# Patient Record
Sex: Female | Born: 1945 | Race: White | Hispanic: No | Marital: Married | Smoking: Never smoker
Health system: Southern US, Community
[De-identification: ages and names within clinical notes are randomized; demographics above are authoritative.]

## PROBLEM LIST (undated history)

## (undated) DIAGNOSIS — F32A Depression, unspecified: Secondary | ICD-10-CM

## (undated) DIAGNOSIS — F329 Major depressive disorder, single episode, unspecified: Secondary | ICD-10-CM

## (undated) DIAGNOSIS — E7800 Pure hypercholesterolemia, unspecified: Secondary | ICD-10-CM

## (undated) DIAGNOSIS — M810 Age-related osteoporosis without current pathological fracture: Secondary | ICD-10-CM

## (undated) DIAGNOSIS — F99 Mental disorder, not otherwise specified: Secondary | ICD-10-CM

## (undated) DIAGNOSIS — I10 Essential (primary) hypertension: Secondary | ICD-10-CM

## (undated) DIAGNOSIS — I509 Heart failure, unspecified: Secondary | ICD-10-CM

## (undated) DIAGNOSIS — R011 Cardiac murmur, unspecified: Secondary | ICD-10-CM

## (undated) DIAGNOSIS — F419 Anxiety disorder, unspecified: Secondary | ICD-10-CM

## (undated) DIAGNOSIS — M1990 Unspecified osteoarthritis, unspecified site: Secondary | ICD-10-CM

## (undated) DIAGNOSIS — I421 Obstructive hypertrophic cardiomyopathy: Secondary | ICD-10-CM

## (undated) HISTORY — PX: ABDOMINAL HYSTERECTOMY: SHX81

## (undated) HISTORY — PX: APPENDECTOMY: SHX54

## (undated) HISTORY — PX: OTHER SURGICAL HISTORY: SHX169

## (undated) HISTORY — PX: COLONOSCOPY: SHX174

## (undated) HISTORY — PX: TUBAL LIGATION: SHX77

## (undated) HISTORY — PX: INSERT / REPLACE / REMOVE PACEMAKER: SUR710

---

## 2007-07-13 ENCOUNTER — Other Ambulatory Visit: Payer: Self-pay

## 2007-07-13 ENCOUNTER — Emergency Department: Payer: Self-pay | Admitting: Emergency Medicine

## 2012-12-02 ENCOUNTER — Ambulatory Visit: Payer: Self-pay | Admitting: Diagnostic Radiology

## 2012-12-02 LAB — CREATININE, SERUM
Creatinine: 0.94 mg/dL (ref 0.60–1.30)
EGFR (African American): >60
EGFR (Non-African Amer.): >60

## 2012-12-03 ENCOUNTER — Ambulatory Visit: Payer: Self-pay | Admitting: Family Medicine

## 2012-12-22 ENCOUNTER — Ambulatory Visit: Payer: Self-pay | Admitting: Gastroenterology

## 2012-12-23 LAB — PATHOLOGY REPORT

## 2013-03-15 ENCOUNTER — Ambulatory Visit: Payer: Self-pay | Admitting: Physical Medicine and Rehabilitation

## 2014-08-11 ENCOUNTER — Ambulatory Visit
Admit: 2014-08-11 | Disposition: A | Discharge status: Home / Self Care | Payer: Self-pay | Attending: Physical Medicine and Rehabilitation | Admitting: Physical Medicine and Rehabilitation

## 2014-11-11 ENCOUNTER — Other Ambulatory Visit: Payer: Self-pay | Admitting: Family Medicine

## 2014-11-11 DIAGNOSIS — Z1239 Encounter for other screening for malignant neoplasm of breast: Secondary | ICD-10-CM

## 2015-03-29 ENCOUNTER — Encounter: Payer: Self-pay | Admitting: *Deleted

## 2015-03-30 ENCOUNTER — Ambulatory Visit
Admission: RE | Admit: 2015-03-30 | Discharge: 2015-03-30 | Disposition: A | Discharge status: Home / Self Care | Payer: Medicare PPO | Source: Ambulatory Visit | Attending: Gastroenterology | Admitting: Gastroenterology

## 2015-03-30 ENCOUNTER — Encounter: Payer: Self-pay | Admitting: *Deleted

## 2015-03-30 ENCOUNTER — Ambulatory Visit: Payer: Medicare PPO | Admitting: Certified Registered Nurse Anesthetist

## 2015-03-30 ENCOUNTER — Encounter
Admission: RE | Disposition: A | Discharge status: Home / Self Care | Payer: Self-pay | Source: Ambulatory Visit | Attending: Gastroenterology

## 2015-03-30 DIAGNOSIS — Z888 Allergy status to other drugs, medicaments and biological substances status: Secondary | ICD-10-CM | POA: Diagnosis not present

## 2015-03-30 DIAGNOSIS — K210 Gastro-esophageal reflux disease with esophagitis: Secondary | ICD-10-CM | POA: Insufficient documentation

## 2015-03-30 DIAGNOSIS — K582 Mixed irritable bowel syndrome: Secondary | ICD-10-CM | POA: Diagnosis not present

## 2015-03-30 DIAGNOSIS — R634 Abnormal weight loss: Secondary | ICD-10-CM | POA: Diagnosis not present

## 2015-03-30 DIAGNOSIS — F419 Anxiety disorder, unspecified: Secondary | ICD-10-CM | POA: Diagnosis not present

## 2015-03-30 DIAGNOSIS — M170 Bilateral primary osteoarthritis of knee: Secondary | ICD-10-CM | POA: Insufficient documentation

## 2015-03-30 DIAGNOSIS — M810 Age-related osteoporosis without current pathological fracture: Secondary | ICD-10-CM | POA: Diagnosis not present

## 2015-03-30 DIAGNOSIS — I10 Essential (primary) hypertension: Secondary | ICD-10-CM | POA: Diagnosis not present

## 2015-03-30 DIAGNOSIS — E559 Vitamin D deficiency, unspecified: Secondary | ICD-10-CM | POA: Insufficient documentation

## 2015-03-30 DIAGNOSIS — E785 Hyperlipidemia, unspecified: Secondary | ICD-10-CM | POA: Insufficient documentation

## 2015-03-30 DIAGNOSIS — Z885 Allergy status to narcotic agent status: Secondary | ICD-10-CM | POA: Insufficient documentation

## 2015-03-30 DIAGNOSIS — Z950 Presence of cardiac pacemaker: Secondary | ICD-10-CM | POA: Diagnosis not present

## 2015-03-30 DIAGNOSIS — Z86010 Personal history of colonic polyps: Secondary | ICD-10-CM | POA: Diagnosis not present

## 2015-03-30 DIAGNOSIS — K449 Diaphragmatic hernia without obstruction or gangrene: Secondary | ICD-10-CM | POA: Insufficient documentation

## 2015-03-30 DIAGNOSIS — Z79899 Other long term (current) drug therapy: Secondary | ICD-10-CM | POA: Diagnosis not present

## 2015-03-30 DIAGNOSIS — R110 Nausea: Secondary | ICD-10-CM | POA: Diagnosis present

## 2015-03-30 HISTORY — PX: ESOPHAGOGASTRODUODENOSCOPY (EGD) WITH PROPOFOL: SHX5813

## 2015-03-30 HISTORY — DX: Pure hypercholesterolemia, unspecified: E78.00

## 2015-03-30 HISTORY — DX: Essential (primary) hypertension: I10

## 2015-03-30 HISTORY — DX: Depression, unspecified: F32.A

## 2015-03-30 HISTORY — DX: Mental disorder, not otherwise specified: F99

## 2015-03-30 HISTORY — DX: Major depressive disorder, single episode, unspecified: F32.9

## 2015-03-30 HISTORY — DX: Anxiety disorder, unspecified: F41.9

## 2015-03-30 HISTORY — DX: Heart failure, unspecified: I50.9

## 2015-03-30 HISTORY — DX: Cardiac murmur, unspecified: R01.1

## 2015-03-30 HISTORY — DX: Unspecified osteoarthritis, unspecified site: M19.90

## 2015-03-30 HISTORY — DX: Age-related osteoporosis without current pathological fracture: M81.0

## 2015-03-30 HISTORY — DX: Obstructive hypertrophic cardiomyopathy: I42.1

## 2015-03-30 SURGERY — ESOPHAGOGASTRODUODENOSCOPY (EGD) WITH PROPOFOL
Anesthesia: General

## 2015-03-30 MED ORDER — SODIUM CHLORIDE 0.9 % IV SOLN
INTRAVENOUS | Status: DC
  Administered 2015-03-30: 11:00:00 via INTRAVENOUS

## 2015-03-30 MED ORDER — GLYCOPYRROLATE 0.2 MG/ML IJ SOLN
INTRAMUSCULAR | Status: DC | PRN
  Administered 2015-03-30: 0.2 mg via INTRAVENOUS

## 2015-03-30 MED ORDER — MIDAZOLAM HCL 2 MG/2ML IJ SOLN
INTRAMUSCULAR | Status: DC | PRN
  Administered 2015-03-30: 1 mg via INTRAVENOUS

## 2015-03-30 MED ORDER — PROPOFOL 10 MG/ML IV BOLUS
INTRAVENOUS | Status: DC | PRN
  Administered 2015-03-30 (×5): 20 mg via INTRAVENOUS

## 2015-03-30 MED ORDER — PROPOFOL 500 MG/50ML IV EMUL
INTRAVENOUS | Status: DC | PRN
Start: 2015-03-30 — End: 2015-03-30
  Administered 2015-03-30: 140 ug/kg/min via INTRAVENOUS

## 2015-03-30 NOTE — Anesthesia Preprocedure Evaluation (Signed)
Anesthesia Evaluation  Patient identified by MRN, date of birth, ID band Patient awake    Reviewed: Allergy & Precautions, NPO status , Patient's Chart, lab work & pertinent test results, reviewed documented beta blocker date and time   Airway Mallampati: III       Dental no notable dental hx.    Pulmonary    breath sounds clear to auscultation       Cardiovascular hypertension, Pt. on medications and Pt. on home beta blockers +CHF  + pacemaker  Rhythm:Regular     Neuro/Psych Anxiety    GI/Hepatic negative GI ROS, Neg liver ROS,   Endo/Other  negative endocrine ROS  Renal/GU negative Renal ROS     Musculoskeletal   Abdominal Normal abdominal exam  (+)   Peds  Hematology negative hematology ROS (+)   Anesthesia Other Findings   Reproductive/Obstetrics                             Anesthesia Physical Anesthesia Plan  ASA: III  Anesthesia Plan: General   Post-op Pain Management:    Induction: Intravenous  Airway Management Planned: Nasal Cannula  Additional Equipment:   Intra-op Plan:   Post-operative Plan:   Informed Consent: I have reviewed the patients History and Physical, chart, labs and discussed the procedure including the risks, benefits and alternatives for the proposed anesthesia with the patient or authorized representative who has indicated his/her understanding and acceptance.     Plan Discussed with: CRNA  Anesthesia Plan Comments:         Anesthesia Quick Evaluation

## 2015-03-30 NOTE — Anesthesia Procedure Notes (Signed)
Date/Time: 03/30/2015 11:02 AM Performed by: Ginger CarneMICHELET, Destaney Sarkis Pre-anesthesia Checklist: Patient identified, Emergency Drugs available, Suction available and Patient being monitored Patient Re-evaluated:Patient Re-evaluated prior to inductionOxygen Delivery Method: Nasal cannula

## 2015-03-30 NOTE — Transfer of Care (Signed)
Immediate Anesthesia Transfer of Care Note  Patient: Rhonda HerterJoanne Neathery Coppolino  Procedure(s) Performed: Procedure(s): ESOPHAGOGASTRODUODENOSCOPY (EGD) WITH PROPOFOL (N/A)  Patient Location: PACU  Anesthesia Type:General  Level of Consciousness: sedated  Airway & Oxygen Therapy: Patient Spontanous Breathing and Patient connected to nasal cannula oxygen  Post-op Assessment: Report given to RN and Post -op Vital signs reviewed and stable  Post vital signs: Reviewed and stable  Last Vitals:  Filed Vitals:   03/30/15 1030 03/30/15 1115  BP: 137/62 100/51  Pulse: 70 70  Temp: 36.9 C 36.3 C  Resp: 19 16    Complications: No apparent anesthesia complications

## 2015-03-30 NOTE — Op Note (Signed)
[facility] Gastroenterology Patient Name: Rhonda Skinner Procedure Date: 03/30/2015 11:02 AM MRN: [id number] Account #: [id number] Date of Birth: Nov 01, 1945 Admit Type: Outpatient Age: 69 [facility] ROOM 4 Gender: Female Note Status: Finalized Procedure:         Upper GI endoscopy Indications:       Suspected esophageal reflux, Anorexia, Nausea, Weight loss Providers:         Ezzard StandingPaul Y. Bluford Kaufmannh, MD Referring MD:      Leim FabryBarbara Aldridge, MD (Referring MD) Medicines:         Monitored Anesthesia Care Complications:     No immediate complications. Procedure:         Pre-Anesthesia Assessment:                    - Prior to the procedure, a History and Physical was                     performed, and patient medications, allergies and                     sensitivities were reviewed. The patient's tolerance of                     previous anesthesia was reviewed.                    - The risks and benefits of the procedure and the sedation                     options and risks were discussed with the patient. All                     questions were answered and informed consent was obtained.                    - After reviewing the risks and benefits, the patient was                     deemed in satisfactory condition to undergo the procedure.                    After obtaining informed consent, the endoscope was passed                     under direct vision. Throughout the procedure, the                     patient's blood pressure, pulse, and oxygen saturations                     were monitored continuously. The Olympus GIF-160 endoscope                     (S#. [id number]) was introduced through the mouth, and                     advanced to the second part of duodenum. The upper GI                     endoscopy was accomplished without difficulty. The patient                     tolerated the procedure well. Findings:      LA Grade A (one or more mucosal  breaks less  than 5 mm, not extending       between tops of 2 mucosal folds) esophagitis was found at the       gastroesophageal junction. Biopsies were taken with a cold forceps for       histology.      The exam was otherwise without abnormality.      A small hiatus hernia was present.      The exam was otherwise without abnormality.      The examined duodenum was normal. Impression:        - LA Grade A reflux esophagitis. Biopsied.                    - The examination was otherwise normal.                    - Small hiatus hernia.                    - The examination was otherwise normal.                    - Normal examined duodenum. Recommendation:    - Discharge patient to home.                    - Observe patient's clinical course.                    - Continue present medications.                    - The findings and recommendations were discussed with the                     patient. Procedure Code(s): --- Professional ---                    [phone], Esophagogastroduodenoscopy, flexible, transoral;                     with biopsy, single or multiple Diagnosis Code(s): --- Professional ---                    K21.0, Gastro-esophageal reflux disease with esophagitis                    K44.9, Diaphragmatic hernia without obstruction or gangrene                    R63.0, Anorexia                    R11.0, Nausea                    R63.4, Abnormal weight loss CPT copyright 2014 American Medical Association. All rights reserved. The codes documented in this report are preliminary and upon coder review may  be revised to meet current compliance requirements. Wallace Cullens, MD 03/30/2015 11:13:32 AM This report has been signed electronically. Number of Addenda: 0 Note Initiated On: 03/30/2015 11:02 AM      [facility]

## 2015-03-30 NOTE — H&P (Signed)
  Date of Initial H&P: 03/14/2015  History reviewed, patient examined, no change in status, stable for surgery. 

## 2015-03-30 NOTE — Anesthesia Postprocedure Evaluation (Signed)
Anesthesia Post Note  Patient: Rhonda Skinner  Procedure(s) Performed: Procedure(s) (LRB): ESOPHAGOGASTRODUODENOSCOPY (EGD) WITH PROPOFOL (N/A)  Patient location during evaluation: PACU Anesthesia Type: General Level of consciousness: awake Pain management: satisfactory to patient Vital Signs Assessment: post-procedure vital signs reviewed and stable Respiratory status: spontaneous breathing Cardiovascular status: stable Anesthetic complications: no    Last Vitals:  Filed Vitals:   03/30/15 1125 03/30/15 1136  BP: 109/61 116/60  Pulse: 75 71  Temp:    Resp: 18 17    Last Pain: There were no vitals filed for this visit.               VAN STAVEREN,Savonna Birchmeier

## 2015-03-31 LAB — SURGICAL PATHOLOGY

## 2015-04-01 ENCOUNTER — Encounter: Payer: Self-pay | Admitting: Gastroenterology

## 2017-05-30 ENCOUNTER — Ambulatory Visit (INDEPENDENT_AMBULATORY_CARE_PROVIDER_SITE_OTHER): Payer: Medicare PPO

## 2017-05-30 ENCOUNTER — Encounter: Payer: Self-pay | Admitting: *Deleted

## 2017-05-30 ENCOUNTER — Ambulatory Visit
Admission: EM | Admit: 2017-05-30 | Discharge: 2017-05-30 | Disposition: A | Discharge status: Home / Self Care | Payer: Medicare PPO | Attending: Family Medicine | Admitting: Family Medicine

## 2017-05-30 DIAGNOSIS — W19XXXA Unspecified fall, initial encounter: Secondary | ICD-10-CM | POA: Diagnosis not present

## 2017-05-30 DIAGNOSIS — S63502A Unspecified sprain of left wrist, initial encounter: Secondary | ICD-10-CM

## 2017-05-30 DIAGNOSIS — S60222A Contusion of left hand, initial encounter: Secondary | ICD-10-CM

## 2017-05-30 NOTE — ED Notes (Signed)
Velcro splint applied left wrist. PMS intact post application

## 2017-05-30 NOTE — ED Provider Notes (Signed)
[facility]    CSN: [id number] Arrival date & time: 05/30/17  1657     History   Chief Complaint Chief Complaint  Patient presents with  . Fall  . Hand Injury    HPI Rhonda Skinner is a 72 y.o. female.   72 yo female with a c/o left hand and wrist pain since falling and landing on her hand a few hours ago. Denies any numbness/tingling. Has swelling and has been applying ice.    The history is provided by the patient.  Fall   Hand Injury    Past Medical History:  Diagnosis Date  . Anxiety   . Arthritis   . CHF (congestive heart failure) (HCC)   . Depression   . Heart murmur   . HOCM (hypertrophic obstructive cardiomyopathy) (HCC)   . Hypercholesteremia   . Hypertension   . Osteoarthritis   . Osteoporosis   . Psychiatric illness     There are no active problems to display for this patient.   Past Surgical History:  Procedure Laterality Date  . ABDOMINAL HYSTERECTOMY    . alcohol ablation of hocm    . APPENDECTOMY    . COLONOSCOPY    . ESOPHAGOGASTRODUODENOSCOPY (EGD) WITH PROPOFOL N/A 03/30/2015   Procedure: ESOPHAGOGASTRODUODENOSCOPY (EGD) WITH PROPOFOL;  Surgeon: Wallace CullensPaul Y Oh, MD;  [facility];  Service: Gastroenterology;  Laterality: N/A;  . INSERT / REPLACE / REMOVE PACEMAKER    . TUBAL LIGATION      OB History    No data available       Home Medications    Prior to Admission medications   Medication Sig Start Date End Date Taking? Authorizing Provider  acetaminophen (TYLENOL) 500 MG tablet Take 500 mg by mouth every 6 (six) hours as needed.   Yes [provider]  calcium-vitamin D (OSCAL WITH D) 500-200 MG-UNIT tablet Take 1 tablet by mouth.   Yes [provider]  carvedilol (COREG) 25 MG tablet Take 25 mg by mouth 2 (two) times daily with a meal.   Yes [provider]  Cholecalciferol 1000 UNIT/10ML LIQD Take by mouth.   Yes [provider]  desvenlafaxine (PRISTIQ) 50 MG  24 hr tablet Take 50 mg by mouth daily.   Yes [provider]  diltiazem (TIAZAC) 360 MG 24 hr capsule Take 360 mg by mouth daily.   Yes [provider]  gabapentin (NEURONTIN) 300 MG capsule Take 300 mg by mouth 3 (three) times daily.   Yes [provider]  losartan (COZAAR) 100 MG tablet Take 100 mg by mouth daily.   Yes [provider]  Multiple Vitamin (MULTIVITAMIN) tablet Take 1 tablet by mouth daily.   Yes [provider]  pravastatin (PRAVACHOL) 40 MG tablet Take 40 mg by mouth daily.   Yes [provider]  ranitidine (ZANTAC) 300 MG tablet Take 300 mg by mouth at bedtime.   Yes [provider]  spironolactone (ALDACTONE) 25 MG tablet Take 25 mg by mouth daily.   Yes [provider]  tizanidine (ZANAFLEX) 2 MG capsule Take 2 mg by mouth 3 (three) times daily.   Yes [provider]  furosemide (LASIX) 20 MG tablet Take 20 mg by mouth.    [provider]  HYDROcodone-acetaminophen (NORCO/VICODIN) 5-325 MG tablet Take 1 tablet by mouth every 6 (six) hours as needed for moderate pain.    [provider]    Family History Family History  Problem Relation Age  of Onset  . Heart Problems Mother   . Cancer Father     Social History Social History   Tobacco Use  . Smoking status: Never Smoker  . Smokeless tobacco: Never Used  Substance Use Topics  . Alcohol use: No  . Drug use: No     Allergies   Oxycodone; Percocet [oxycodone-acetaminophen]; Phenobarbital; and Vasotec [enalapril maleate]   Review of Systems Review of Systems   Physical Exam Triage Vital Signs ED Triage Vitals  Enc Vitals Group     BP 05/30/17 1720 (!) 163/56     Pulse Rate 05/30/17 1720 60     Resp 05/30/17 1720 16     Temp 05/30/17 1720 98.2 F (36.8 C)     Temp Source 05/30/17 1720 Oral     SpO2 05/30/17 1720 98 %     Weight 05/30/17 1724 143 lb (64.9 kg)     Height 05/30/17 1724 5\' 2"  (1.575 m)      Head Circumference --      Peak Flow --      Pain Score 05/30/17 1724 6     Pain Loc --      Pain Edu? --      Excl. in GC? --    No data found.  Updated Vital Signs BP (!) 163/56 (BP Location: Left Arm)   Pulse 60   Temp 98.2 F (36.8 C) (Oral)   Resp 16   Ht 5\' 2"  (1.575 m)   Wt 143 lb (64.9 kg)   SpO2 98%   BMI 26.16 kg/m   Visual Acuity Right Eye Distance:   Left Eye Distance:   Bilateral Distance:    Right Eye Near:   Left Eye Near:    Bilateral Near:     Physical Exam  Constitutional: She appears well-developed and well-nourished. No distress.  Musculoskeletal:       Left wrist: She exhibits tenderness, bony tenderness and swelling (mild). She exhibits normal range of motion, no effusion, no crepitus, no deformity and no laceration.       Left hand: She exhibits tenderness, bony tenderness (diffuse) and swelling (mild). She exhibits normal range of motion, normal two-point discrimination, normal capillary refill, no deformity and no laceration. Normal sensation noted. Normal strength noted.  Skin: She is not diaphoretic.  Nursing note and vitals reviewed.    UC Treatments / Results  Labs (all labs ordered are listed, but only abnormal results are displayed) Labs Reviewed - No data to display  EKG  EKG Interpretation None       Radiology Dg Wrist Complete Left  Result Date: 05/30/2017 CLINICAL DATA:  72 year old female status post fall on outstretched hand. Pain radiating to the base of the left thumb and wrist. EXAM: LEFT WRIST - COMPLETE 3+ VIEW COMPARISON:  Left hand series today reported separately. FINDINGS: Mild osteopenia. The distal left radius and ulna appear intact. Carpal bone alignment and joint spaces are within normal limits. No scaphoid fracture identified. The carpometacarpal joint spaces are normal for age. The visible metacarpals appear intact. IMPRESSION: No acute fracture or dislocation identified about the left wrist. Osteopenia,  consider follow-up radiographs if symptoms persist. Electronically Signed   By: Odessa Fleming M.D.   On: 05/30/2017 18:12   Dg Hand Complete Left  Result Date: 05/30/2017 CLINICAL DATA:  72 year old female status post fall on outstretched hand. Pain radiating to the base of the left thumb and wrist. EXAM: LEFT HAND - COMPLETE 3+ VIEW COMPARISON:  None.  FINDINGS: Distal radius and ulna appear intact. Carpal bone alignment and joint spaces within normal limits. The basal joint of the left thumb appears normal for age. The left thumb metacarpal and phalanges appear intact and normally aligned. Other metacarpals and phalanges appear intact. No acute osseous abnormality identified. IMPRESSION: No acute fracture or dislocation identified about the left hand. Electronically Signed   By: Odessa Fleming M.D.   On: 05/30/2017 18:10    Procedures Procedures (including critical care time)  Medications Ordered in UC Medications - No data to display   Initial Impression / Assessment and Plan / UC Course  I have reviewed the triage vital signs and the nursing notes.  Pertinent labs & imaging results that were available during my care of the patient were reviewed by me and considered in my medical decision making (see chart for details).       Final Clinical Impressions(s) / UC Diagnoses   Final diagnoses:  Contusion of left hand, initial encounter  Sprain of left wrist, initial encounter    ED Discharge Orders    None     1. x-ray results and diagnosis reviewed with patient 2. Recommend supportive treatment with rest, ice, otc tylenol prn; wrist splint 3. Follow-up prn if symptoms worsen or don't improve  Controlled Substance Prescriptions [place] Controlled Substance Registry consulted? Not Applicable   Payton Mccallum, MD 05/30/17 [phone]

## 2017-05-30 NOTE — Discharge Instructions (Signed)
Rest, ice, wrist splint

## 2017-05-30 NOTE — ED Triage Notes (Signed)
Pt was bending over and lost her balance, landed on left hand. Now c/o left hand pain and redness.

## 2017-06-25 ENCOUNTER — Inpatient Hospital Stay
Admission: EM | Admit: 2017-06-25 | Discharge: 2017-06-28 | DRG: 202 | Disposition: A | Discharge status: Home / Self Care | Payer: Medicare PPO | Attending: Internal Medicine | Admitting: Internal Medicine

## 2017-06-25 ENCOUNTER — Other Ambulatory Visit: Payer: Self-pay

## 2017-06-25 ENCOUNTER — Encounter: Payer: Self-pay | Admitting: Emergency Medicine

## 2017-06-25 ENCOUNTER — Emergency Department: Payer: Medicare PPO

## 2017-06-25 DIAGNOSIS — R531 Weakness: Secondary | ICD-10-CM

## 2017-06-25 DIAGNOSIS — F329 Major depressive disorder, single episode, unspecified: Secondary | ICD-10-CM | POA: Diagnosis present

## 2017-06-25 DIAGNOSIS — J206 Acute bronchitis due to rhinovirus: Secondary | ICD-10-CM | POA: Diagnosis not present

## 2017-06-25 DIAGNOSIS — I421 Obstructive hypertrophic cardiomyopathy: Secondary | ICD-10-CM | POA: Diagnosis present

## 2017-06-25 DIAGNOSIS — Z90710 Acquired absence of both cervix and uterus: Secondary | ICD-10-CM | POA: Diagnosis not present

## 2017-06-25 DIAGNOSIS — J069 Acute upper respiratory infection, unspecified: Secondary | ICD-10-CM

## 2017-06-25 DIAGNOSIS — E871 Hypo-osmolality and hyponatremia: Secondary | ICD-10-CM

## 2017-06-25 DIAGNOSIS — I110 Hypertensive heart disease with heart failure: Secondary | ICD-10-CM | POA: Diagnosis present

## 2017-06-25 DIAGNOSIS — J0100 Acute maxillary sinusitis, unspecified: Secondary | ICD-10-CM

## 2017-06-25 DIAGNOSIS — E7800 Pure hypercholesterolemia, unspecified: Secondary | ICD-10-CM | POA: Diagnosis present

## 2017-06-25 DIAGNOSIS — E785 Hyperlipidemia, unspecified: Secondary | ICD-10-CM | POA: Diagnosis present

## 2017-06-25 DIAGNOSIS — R05 Cough: Secondary | ICD-10-CM | POA: Diagnosis not present

## 2017-06-25 DIAGNOSIS — E860 Dehydration: Secondary | ICD-10-CM | POA: Diagnosis present

## 2017-06-25 DIAGNOSIS — J45909 Unspecified asthma, uncomplicated: Secondary | ICD-10-CM | POA: Diagnosis present

## 2017-06-25 DIAGNOSIS — I5022 Chronic systolic (congestive) heart failure: Secondary | ICD-10-CM | POA: Diagnosis present

## 2017-06-25 DIAGNOSIS — B9789 Other viral agents as the cause of diseases classified elsewhere: Secondary | ICD-10-CM

## 2017-06-25 DIAGNOSIS — J40 Bronchitis, not specified as acute or chronic: Secondary | ICD-10-CM | POA: Diagnosis present

## 2017-06-25 LAB — OSMOLALITY: Osmolality: 261 mOsm/kg — ABNORMAL LOW (ref 275–295)

## 2017-06-25 LAB — URINALYSIS, COMPLETE (UACMP) WITH MICROSCOPIC
Bacteria, UA: NONE SEEN
Bilirubin Urine: NEGATIVE
Glucose, UA: NEGATIVE mg/dL
Ketones, ur: NEGATIVE mg/dL
Leukocytes, UA: NEGATIVE
Nitrite: NEGATIVE
Protein, ur: NEGATIVE mg/dL
SPECIFIC GRAVITY, URINE: 1.004 — AB (ref 1.005–1.030)
pH: 7 (ref 5.0–8.0)

## 2017-06-25 LAB — COMPREHENSIVE METABOLIC PANEL
ALK PHOS: 88 U/L (ref 38–126)
ALT: 34 U/L (ref 14–54)
AST: 28 U/L (ref 15–41)
Albumin: 3.4 g/dL — ABNORMAL LOW (ref 3.5–5.0)
Anion gap: 10 (ref 5–15)
BILIRUBIN TOTAL: 0.6 mg/dL (ref 0.3–1.2)
BUN: 6 mg/dL (ref 6–20)
CALCIUM: 8.6 mg/dL — AB (ref 8.9–10.3)
CO2: 22 mmol/L (ref 22–32)
CREATININE: 0.62 mg/dL (ref 0.44–1.00)
Chloride: 91 mmol/L — ABNORMAL LOW (ref 101–111)
Glucose, Bld: 135 mg/dL — ABNORMAL HIGH (ref 65–99)
Potassium: 4.2 mmol/L (ref 3.5–5.1)
Sodium: 123 mmol/L — ABNORMAL LOW (ref 135–145)
TOTAL PROTEIN: 7 g/dL (ref 6.5–8.1)

## 2017-06-25 LAB — CBC WITH DIFFERENTIAL/PLATELET
BASOS ABS: 0.1 10*3/uL (ref 0–0.1)
BASOS PCT: 1 %
Eosinophils Absolute: 0.1 10*3/uL (ref 0–0.7)
Eosinophils Relative: 2 %
HCT: 40.1 % (ref 35.0–47.0)
HEMOGLOBIN: 13.9 g/dL (ref 12.0–16.0)
Lymphocytes Relative: 12 %
Lymphs Abs: 1.1 10*3/uL (ref 1.0–3.6)
MCH: 34.1 pg — ABNORMAL HIGH (ref 26.0–34.0)
MCHC: 34.7 g/dL (ref 32.0–36.0)
MCV: 98.3 fL (ref 80.0–100.0)
Monocytes Absolute: 0.8 10*3/uL (ref 0.2–0.9)
Monocytes Relative: 9 %
NEUTROS PCT: 76 %
Neutro Abs: 7.2 10*3/uL — ABNORMAL HIGH (ref 1.4–6.5)
Platelets: 295 10*3/uL (ref 150–440)
RBC: 4.08 MIL/uL (ref 3.80–5.20)
RDW: 12.8 % (ref 11.5–14.5)
WBC: 9.4 10*3/uL (ref 3.6–11.0)

## 2017-06-25 LAB — INFLUENZA PANEL BY PCR (TYPE A & B)
INFLAPCR: NEGATIVE
INFLBPCR: NEGATIVE

## 2017-06-25 LAB — SODIUM, URINE, RANDOM: SODIUM UR: 64 mmol/L

## 2017-06-25 LAB — CREATININE, URINE, RANDOM: Creatinine, Urine: 19 mg/dL

## 2017-06-25 LAB — OSMOLALITY, URINE: OSMOLALITY UR: 208 mosm/kg — AB (ref 300–900)

## 2017-06-25 MED ORDER — METHYLPREDNISOLONE SODIUM SUCC 40 MG IJ SOLR
40.0000 mg | Freq: Every day | INTRAMUSCULAR | Status: DC
  Administered 2017-06-25 – 2017-06-28 (×4): 40 mg via INTRAVENOUS
  Filled 2017-06-25 (×3): qty 1

## 2017-06-25 MED ORDER — CALCIUM CARBONATE-VITAMIN D 500-200 MG-UNIT PO TABS
1.0000 | ORAL_TABLET | Freq: Every day | ORAL | Status: DC
  Administered 2017-06-26 – 2017-06-28 (×3): 1 via ORAL
  Filled 2017-06-25 (×3): qty 1

## 2017-06-25 MED ORDER — FAMOTIDINE 20 MG PO TABS
20.0000 mg | ORAL_TABLET | Freq: Every day | ORAL | Status: DC
  Administered 2017-06-25 – 2017-06-27 (×3): 20 mg via ORAL
  Filled 2017-06-25 (×3): qty 1

## 2017-06-25 MED ORDER — DILTIAZEM HCL ER BEADS 240 MG PO CP24
360.0000 mg | ORAL_CAPSULE | Freq: Every day | ORAL | Status: DC
  Administered 2017-06-26 – 2017-06-28 (×3): 360 mg via ORAL
  Filled 2017-06-25 (×3): qty 1

## 2017-06-25 MED ORDER — ADULT MULTIVITAMIN W/MINERALS CH
1.0000 | ORAL_TABLET | Freq: Every day | ORAL | Status: DC
  Administered 2017-06-26 – 2017-06-28 (×3): 1 via ORAL
  Filled 2017-06-25 (×3): qty 1

## 2017-06-25 MED ORDER — GABAPENTIN 300 MG PO CAPS
300.0000 mg | ORAL_CAPSULE | Freq: Two times a day (BID) | ORAL | Status: DC
  Administered 2017-06-25 – 2017-06-28 (×6): 300 mg via ORAL
  Filled 2017-06-25 (×6): qty 1

## 2017-06-25 MED ORDER — ONDANSETRON HCL 4 MG/2ML IJ SOLN: 4.0000 mg | Freq: Four times a day (QID) | INTRAMUSCULAR | Status: DC | PRN

## 2017-06-25 MED ORDER — ENOXAPARIN SODIUM 40 MG/0.4ML ~~LOC~~ SOLN
40.0000 mg | SUBCUTANEOUS | Status: DC
  Administered 2017-06-25 – 2017-06-27 (×3): 40 mg via SUBCUTANEOUS
  Filled 2017-06-25 (×3): qty 0.4

## 2017-06-25 MED ORDER — IPRATROPIUM-ALBUTEROL 0.5-2.5 (3) MG/3ML IN SOLN
RESPIRATORY_TRACT | Status: AC
  Administered 2017-06-25: 3 mL via RESPIRATORY_TRACT
  Filled 2017-06-25: qty 3

## 2017-06-25 MED ORDER — PRAVASTATIN SODIUM 40 MG PO TABS
80.0000 mg | ORAL_TABLET | Freq: Every day | ORAL | Status: DC
  Administered 2017-06-25 – 2017-06-26 (×2): 80 mg via ORAL
  Administered 2017-06-27: 20 mg via ORAL
  Filled 2017-06-25: qty 4
  Filled 2017-06-25 (×2): qty 2
  Filled 2017-06-25: qty 4
  Filled 2017-06-25: qty 2
  Filled 2017-06-25: qty 1
  Filled 2017-06-25: qty 2
  Filled 2017-06-25: qty 4

## 2017-06-25 MED ORDER — BUSPIRONE HCL 5 MG PO TABS
15.0000 mg | ORAL_TABLET | Freq: Two times a day (BID) | ORAL | Status: DC
  Administered 2017-06-25 – 2017-06-28 (×6): 15 mg via ORAL
  Filled 2017-06-25 (×7): qty 3

## 2017-06-25 MED ORDER — VITAMIN D 1000 UNITS PO TABS
1000.0000 [IU] | ORAL_TABLET | Freq: Every day | ORAL | Status: DC
Start: 2017-06-26 — End: 2017-06-28
  Administered 2017-06-26 – 2017-06-28 (×3): 1000 [IU] via ORAL
  Filled 2017-06-25 (×3): qty 1

## 2017-06-25 MED ORDER — ORAL CARE MOUTH RINSE
15.0000 mL | Freq: Two times a day (BID) | OROMUCOSAL | Status: DC
  Administered 2017-06-25 – 2017-06-28 (×6): 15 mL via OROMUCOSAL

## 2017-06-25 MED ORDER — BUDESONIDE 0.5 MG/2ML IN SUSP
0.5000 mg | Freq: Two times a day (BID) | RESPIRATORY_TRACT | Status: DC
  Administered 2017-06-25 – 2017-06-28 (×6): 0.5 mg via RESPIRATORY_TRACT
  Filled 2017-06-25 (×6): qty 2

## 2017-06-25 MED ORDER — ONDANSETRON HCL 4 MG PO TABS: 4.0000 mg | ORAL_TABLET | Freq: Four times a day (QID) | ORAL | Status: DC | PRN

## 2017-06-25 MED ORDER — AMOXICILLIN-POT CLAVULANATE 875-125 MG PO TABS
1.0000 | ORAL_TABLET | Freq: Two times a day (BID) | ORAL | Status: DC
  Administered 2017-06-25 – 2017-06-28 (×6): 1 via ORAL
  Filled 2017-06-25 (×6): qty 1

## 2017-06-25 MED ORDER — SODIUM CHLORIDE 0.9 % IV SOLN
INTRAVENOUS | Status: DC
  Administered 2017-06-25 – 2017-06-26 (×2): via INTRAVENOUS
  Administered 2017-06-27: 01:00:00 40 mL/h via INTRAVENOUS
  Administered 2017-06-28: 03:00:00 via INTRAVENOUS

## 2017-06-25 MED ORDER — IPRATROPIUM-ALBUTEROL 0.5-2.5 (3) MG/3ML IN SOLN
3.0000 mL | Freq: Four times a day (QID) | RESPIRATORY_TRACT | Status: DC
  Administered 2017-06-25 – 2017-06-28 (×13): 3 mL via RESPIRATORY_TRACT
  Filled 2017-06-25 (×12): qty 3

## 2017-06-25 MED ORDER — METHYLPREDNISOLONE SODIUM SUCC 40 MG IJ SOLR
INTRAMUSCULAR | Status: AC
  Administered 2017-06-25: 40 mg via INTRAVENOUS
  Filled 2017-06-25: qty 1

## 2017-06-25 MED ORDER — ACETAMINOPHEN 500 MG PO TABS
500.0000 mg | ORAL_TABLET | Freq: Four times a day (QID) | ORAL | Status: DC | PRN
  Administered 2017-06-25 – 2017-06-28 (×7): 500 mg via ORAL
  Filled 2017-06-25 (×7): qty 1

## 2017-06-25 MED ORDER — DESVENLAFAXINE SUCCINATE ER 50 MG PO TB24
50.0000 mg | ORAL_TABLET | Freq: Every day | ORAL | Status: DC
  Administered 2017-06-26 – 2017-06-28 (×3): 50 mg via ORAL
  Filled 2017-06-25 (×3): qty 1

## 2017-06-25 MED ORDER — CARVEDILOL 25 MG PO TABS
25.0000 mg | ORAL_TABLET | Freq: Two times a day (BID) | ORAL | Status: DC
  Administered 2017-06-25 – 2017-06-28 (×6): 25 mg via ORAL
  Filled 2017-06-25 (×6): qty 1

## 2017-06-25 MED ORDER — TIZANIDINE HCL 2 MG PO TABS
1.0000 mg | ORAL_TABLET | Freq: Three times a day (TID) | ORAL | Status: DC | PRN
  Administered 2017-06-27 (×2): 1 mg via ORAL
  Filled 2017-06-25 (×3): qty 1

## 2017-06-25 MED ORDER — LOSARTAN POTASSIUM 50 MG PO TABS
100.0000 mg | ORAL_TABLET | Freq: Every day | ORAL | Status: DC
  Administered 2017-06-26 – 2017-06-28 (×3): 100 mg via ORAL
  Filled 2017-06-25 (×3): qty 2

## 2017-06-25 MED ORDER — DEXTROMETHORPHAN-GUAIFENESIN 10-100 MG/5ML PO LIQD
5.0000 mL | ORAL | Status: DC | PRN
  Administered 2017-06-26: 5 mL via ORAL
  Filled 2017-06-25 (×2): qty 5

## 2017-06-25 NOTE — H&P (Signed)
[facility] [place] [facility]   PATIENT NAME: Rhonda Skinner    MR#:  [id number]  DATE OF BIRTH:  May 20, 1945  DATE OF ADMISSION:  06/25/2017  PRIMARY CARE PHYSICIAN: Leim Fabry, MD   REQUESTING/REFERRING PHYSICIAN: Dr Fanny Bien  CHIEF COMPLAINT:   Chief Complaint  Patient presents with  . Cough    HISTORY OF PRESENT ILLNESS:  Rhonda Skinner  is a 72 y.o. female that is not been feeling well for about a week.  She has been having sore throat and achiness and then a head cold.  She has been blowing her nose.  She has been coughing up brown bloody sputum.  She has been having sinus pain.  Her throat is been burning.  She is been feeling very tired and fatigued.  She has been dizzy.  She has been wheezing.  In the ER, she was found to have a low sodium.  She states that she thought she was eating okay but may not have been.  Hospitalist services were contacted for further evaluation  PAST MEDICAL HISTORY:   Past Medical History:  Diagnosis Date  . Anxiety   . Arthritis   . CHF (congestive heart failure) (HCC)   . Depression   . Heart murmur   . HOCM (hypertrophic obstructive cardiomyopathy) (HCC)   . Hypercholesteremia   . Hypertension   . Osteoarthritis   . Osteoporosis   . Psychiatric illness     PAST SURGICAL HISTORY:   Past Surgical History:  Procedure Laterality Date  . ABDOMINAL HYSTERECTOMY    . alcohol ablation of hocm    . APPENDECTOMY    . COLONOSCOPY    . ESOPHAGOGASTRODUODENOSCOPY (EGD) WITH PROPOFOL N/A 03/30/2015   Procedure: ESOPHAGOGASTRODUODENOSCOPY (EGD) WITH PROPOFOL;  Surgeon: Wallace Cullens, MD;  Location: [facility] ENDOSCOPY;  Service: Gastroenterology;  Laterality: N/A;  . INSERT / REPLACE / REMOVE PACEMAKER    . TUBAL LIGATION      SOCIAL HISTORY:   Social History   Tobacco Use  . Smoking status: Never Smoker  . Smokeless tobacco: Never Used  Substance Use Topics  . Alcohol use: No    FAMILY HISTORY:    Family History  Problem Relation Age of Onset  . Heart Problems Mother   . Cancer Father     DRUG ALLERGIES:   Allergies  Allergen Reactions  . Oxycodone   . Percocet [Oxycodone-Acetaminophen]   . Phenobarbital   . Vasotec [Enalapril Maleate]     REVIEW OF SYSTEMS:  CONSTITUTIONAL: No fever, chills or sweats.  Positive for fatigue and weakness.  EYES: No blurred or double vision.  EARS, NOSE, AND THROAT: No tinnitus or ear pain. No sore throat.  Positive for runny nose RESPIRATORY: Positive for cough, shortness of breath and wheezing.  No hemoptysis.  CARDIOVASCULAR: No chest pain, orthopnea, edema.  GASTROINTESTINAL: No nausea, vomiting, diarrhea.  Some abdominal pain with coughing.  No blood in bowel movements GENITOURINARY: No dysuria, hematuria.  ENDOCRINE: No polyuria, nocturia,  HEMATOLOGY: No anemia, easy bruising or bleeding SKIN: No rash or lesion. MUSCULOSKELETAL: body aches NEUROLOGIC: No tingling, numbness, weakness.  PSYCHIATRY: No anxiety or depression.   MEDICATIONS AT HOME:   Prior to Admission medications   Medication Sig Start Date End Date Taking? Authorizing Provider  acetaminophen (TYLENOL) 500 MG tablet Take 500 mg by mouth every 6 (six) hours as needed.   Yes [provider]  Biotin 5 MG TABS Take 1 tablet by mouth daily.  Yes [provider]  busPIRone (BUSPAR) 15 MG tablet Take 15 mg by mouth 2 (two) times daily.   Yes [provider]  calcium-vitamin D (OSCAL WITH D) 500-200 MG-UNIT tablet Take 1 tablet by mouth.   Yes [provider]  carvedilol (COREG) 25 MG tablet Take 25 mg by mouth 2 (two) times daily with a meal.   Yes [provider]  Cholecalciferol (D3 HIGH POTENCY) 1000 units capsule Take 1,000 Units by mouth daily.   Yes [provider]  desvenlafaxine (PRISTIQ) 50 MG 24 hr tablet Take 50 mg by mouth daily.   Yes [provider]  diltiazem (TIAZAC) 360 MG 24 hr capsule  Take 360 mg by mouth daily.   Yes [provider]  ergocalciferol (VITAMIN D2) 50000 units capsule Take 50,000 Units by mouth every 30 (thirty) days. On the first of every month   Yes [provider]  gabapentin (NEURONTIN) 300 MG capsule Take 1 capsule (300MG ) by mouth every morning and 2 capsules (600MG ) by mouth every evening   Yes [provider]  losartan (COZAAR) 100 MG tablet Take 100 mg by mouth daily.   Yes [provider]  Multiple Vitamin (MULTIVITAMIN) tablet Take 1 tablet by mouth daily.   Yes [provider]  pravastatin (PRAVACHOL) 80 MG tablet Take 80 mg by mouth daily.    Yes [provider]  ranitidine (ZANTAC) 150 MG capsule Take 150 mg by mouth at bedtime.    Yes [provider]  spironolactone (ALDACTONE) 25 MG tablet Take 25 mg by mouth daily.   Yes [provider]  tiZANidine (ZANAFLEX) 2 MG tablet Take  to 1 tablet by mouth three times daily as needed   Yes [provider]      VITAL SIGNS:  Blood pressure (!) 135/43, pulse (!) 59, temperature 98.7 F (37.1 C), temperature source Oral, resp. rate 20, height 5\' 2"  (1.575 m), weight 64.9 kg (143 lb), SpO2 91 %.  PHYSICAL EXAMINATION:  GENERAL:  72 y.o.-year-old patient lying in the bed with no acute distress.  EYES: Pupils equal, round, reactive to light and accommodation. No scleral icterus. Extraocular muscles intact.  HEENT: Head atraumatic, normocephalic. Oropharynx and nasopharynx clear.  Tympanic membrane bulging and erythematous NECK:  Supple, no jugular venous distention. No thyroid enlargement, no tenderness.  LUNGS:  decreased breath sounds bilaterally,  expiratory wheezing no.  rales,rhonchi or crepitation. No use of accessory muscles of respiration.  CARDIOVASCULAR: S1, S2 normal. No murmurs, rubs, or gallops.  ABDOMEN: Soft, nontender, nondistended. Bowel sounds present. No organomegaly or mass.  EXTREMITIES: No pedal edema,  cyanosis, or clubbing.  NEUROLOGIC: Cranial nerves II through XII are intact. Muscle strength 5/5 in all extremities. Sensation intact. Gait not checked.  PSYCHIATRIC: The patient is alert and oriented x 3.  SKIN: No rash, lesion, or ulcer.  Skin tenting with exam  LABORATORY PANEL:   CBC Recent Labs  Lab 06/25/17 1024  WBC 9.4  HGB 13.9  HCT 40.1  PLT 295   ------------------------------------------------------------------------------------------------------------------  Chemistries  Recent Labs  Lab 06/25/17 1024  NA 123*  K 4.2  CL 91*  CO2 22  GLUCOSE 135*  BUN 6  CREATININE 0.62  CALCIUM 8.6*  AST 28  ALT 34  ALKPHOS 88  BILITOT 0.6   ------------------------------------------------------------------------------------------------------------------    RADIOLOGY:  Dg Chest 2 View  Result Date: 06/25/2017 CLINICAL DATA:  Cough for the past 2 weeks. EXAM: CHEST - 2 VIEW COMPARISON:  Chest x-ray dated July 13, 2007. FINDINGS: Unchanged right chest wall pacemaker. Stable cardiomegaly. Mild pulmonary vascular congestion and interstitial edema. New blunting of the right costophrenic angle with atelectasis/scarring at the right lung base. Unchanged blunting of the left costophrenic angle. No focal consolidation, pleural effusion, or pneumothorax. No acute osseous abnormality. IMPRESSION: 1. Cardiomegaly with mild vascular congestion and interstitial edema. Electronically Signed   By: Obie Dredge M.D.   On: 06/25/2017 11:26    EKG:   Ordered by me  IMPRESSION AND PLAN:   1.  Acute bronchitis with asthmatic component.  Start Solu-Medrol, DuoNeb and budesonide nebulizers. 2.  Hyponatremia.  With skin tenting she seems a little dehydrated.  Gentle IV fluid hydration. Hold spironolactone 3.  History of cardiomyopathy and systolic congestive heart failure.  Watch closely with IV fluids. 4.  Hyperlipidemia unspecified on pravastatin 5.  Depression continue psychiatric  medications 6. Essential Hypertension- continue usual meds  I have reviewed laboratory data, chest x-ray and have ordered an EKG patient   Management plans discussed with the patient, family and they are in agreement.  CODE STATUS: Full code  TOTAL TIME TAKING CARE OF THIS PATIENT: 50 minutes.    Alford Highland M.D on 06/25/2017 at 4:16 PM  Between 7am to 6pm - Pager - [phone]  After 6pm call admission pager [phone]  [facility] Office  [phone]  CC: Primary care physician; Leim Fabry, MD

## 2017-06-25 NOTE — ED Triage Notes (Addendum)
Has had cough with brown sputum X 2 weeks. Has had same drainage from nose as well with congestion.  Felt achy initially but this seems better.  Ambulatory without assistance and with steady gait.   Denies pain currently but stomach muscles sore when coughs.  Unlabored.

## 2017-06-25 NOTE — ED Provider Notes (Signed)
[facility] Emergency Department Provider Note   ____________________________________________   First MD Initiated Contact with Patient 06/25/17 1450     (approximate)  I have reviewed the triage vital signs and the nursing notes.   HISTORY  Chief Complaint Cough    HPI Rhonda Skinner is a 72 y.o. female reports she has had a cough with congestion upper respiratory symptoms for about 2 weeks.  Increasing fatigue, weakness and generalized tiredness over the last 2-3 days.  She has been experiencing cough.  No nausea or vomiting.  Does report slight decrease in appetite.  Also reports a history of congestive heart failure, but reports that she is seen less swelling in her legs than previous.  She currently takes spironolactone.  Patient does report cough with productive brown sputum.  Also nasal drainage and some slight discomfort over her right-sided sinuses.    Past Medical History:  Diagnosis Date  . Anxiety   . Arthritis   . CHF (congestive heart failure) (HCC)   . Depression   . Heart murmur   . HOCM (hypertrophic obstructive cardiomyopathy) (HCC)   . Hypercholesteremia   . Hypertension   . Osteoarthritis   . Osteoporosis   . Psychiatric illness     Patient Active Problem List   Diagnosis Date Noted  . Bronchitis 06/25/2017    Past Surgical History:  Procedure Laterality Date  . ABDOMINAL HYSTERECTOMY    . alcohol ablation of hocm    . APPENDECTOMY    . COLONOSCOPY    . ESOPHAGOGASTRODUODENOSCOPY (EGD) WITH PROPOFOL N/A 03/30/2015   Procedure: ESOPHAGOGASTRODUODENOSCOPY (EGD) WITH PROPOFOL;  Surgeon: Wallace Cullens, MD;  Location: [facility] ENDOSCOPY;  Service: Gastroenterology;  Laterality: N/A;  . INSERT / REPLACE / REMOVE PACEMAKER    . TUBAL LIGATION      Prior to Admission medications   Medication Sig Start Date End Date Taking? Authorizing Provider  acetaminophen (TYLENOL) 500 MG tablet Take 500 mg by mouth every 6 (six)  hours as needed.   Yes [provider]  Biotin 5 MG TABS Take 1 tablet by mouth daily.   Yes [provider]  busPIRone (BUSPAR) 15 MG tablet Take 15 mg by mouth 2 (two) times daily.   Yes [provider]  calcium-vitamin D (OSCAL WITH D) 500-200 MG-UNIT tablet Take 1 tablet by mouth.   Yes [provider]  carvedilol (COREG) 25 MG tablet Take 25 mg by mouth 2 (two) times daily with a meal.   Yes [provider]  Cholecalciferol (D3 HIGH POTENCY) 1000 units capsule Take 1,000 Units by mouth daily.   Yes [provider]  desvenlafaxine (PRISTIQ) 50 MG 24 hr tablet Take 50 mg by mouth daily.   Yes [provider]  diltiazem (TIAZAC) 360 MG 24 hr capsule Take 360 mg by mouth daily.   Yes [provider]  ergocalciferol (VITAMIN D2) 50000 units capsule Take 50,000 Units by mouth every 30 (thirty) days. On the first of every month   Yes [provider]  gabapentin (NEURONTIN) 300 MG capsule Take 1 capsule (300MG ) by mouth every morning and 2 capsules (600MG ) by mouth every evening   Yes [provider]  losartan (COZAAR) 100 MG tablet Take 100 mg by mouth daily.   Yes [provider]  Multiple Vitamin (MULTIVITAMIN) tablet Take 1 tablet by mouth daily.   Yes [provider]  pravastatin (PRAVACHOL) 80 MG tablet Take 80 mg by mouth daily.  Yes [provider]  ranitidine (ZANTAC) 150 MG capsule Take 150 mg by mouth at bedtime.    Yes [provider]  spironolactone (ALDACTONE) 25 MG tablet Take 25 mg by mouth daily.   Yes [provider]  tiZANidine (ZANAFLEX) 2 MG tablet Take  to 1 tablet by mouth three times daily as needed   Yes [provider]    Allergies Oxycodone; Percocet [oxycodone-acetaminophen]; Phenobarbital; and Vasotec [enalapril maleate]  Family History  Problem Relation Age of Onset  . Heart Problems Mother   . Cancer Father     Social  History Social History   Tobacco Use  . Smoking status: Never Smoker  . Smokeless tobacco: Never Used  Substance Use Topics  . Alcohol use: No  . Drug use: No    Review of Systems Constitutional: No fever/chills but increased fatigue and weakness Eyes: No visual changes. ENT: No sore throat.  Scratchy at times. Cardiovascular: Denies chest pain. Respiratory: Denies shortness of breath.  Cough, productive for about a week to 2. Gastrointestinal: No abdominal pain.  No diarrhea.  No constipation. Genitourinary: Negative for dysuria. Musculoskeletal: Negative for back pain. Skin: Negative for rash. Neurological: Negative for headaches, focal weakness or numbness.    ____________________________________________   PHYSICAL EXAM:  VITAL SIGNS: ED Triage Vitals  Enc Vitals Group     BP 06/25/17 1020 (!) 163/54     Pulse Rate 06/25/17 1020 70     Resp 06/25/17 1020 20     Temp 06/25/17 1020 98.7 F (37.1 C)     Temp Source 06/25/17 1020 Oral     SpO2 06/25/17 1020 95 %     Weight 06/25/17 1019 143 lb (64.9 kg)     Height 06/25/17 1019 5\' 2"  (1.575 m)     Head Circumference --      Peak Flow --      Pain Score 06/25/17 1446 5     Pain Loc --      Pain Edu? --      Excl. in GC? --     Constitutional: Alert and oriented. Well appearing and in no acute distress. Eyes: Conjunctivae are normal. Head: Atraumatic.  Some mild tenderness overlying the right maxillary sinuses. Nose: No congestion/rhinnorhea. Mouth/Throat: Mucous membranes are moist. Neck: No stridor.   Cardiovascular: Normal rate, regular rhythm. Grossly normal heart sounds.  Good peripheral circulation. Respiratory: Normal respiratory effort.  No retractions. Lungs CTAB. Gastrointestinal: Soft and nontender. No distention. Musculoskeletal: No lower extremity tenderness nor edema. Neurologic:  Normal speech and language. No gross focal neurologic deficits are appreciated.  Skin:  Skin is warm, dry and  intact. No rash noted. Psychiatric: Mood and affect are normal. Speech and behavior are normal.  Overall exam appears to be euvolemic.  Some slight increase in skin turgor.  ____________________________________________   LABS (all labs ordered are listed, but only abnormal results are displayed)  Labs Reviewed  COMPREHENSIVE METABOLIC PANEL - Abnormal; Notable for the following components:      Result Value   Sodium 123 (*)    Chloride 91 (*)    Glucose, Bld 135 (*)    Calcium 8.6 (*)    Albumin 3.4 (*)    All other components within normal limits  CBC WITH DIFFERENTIAL/PLATELET - Abnormal; Notable for the following components:   MCH 34.1 (*)    Neutro Abs 7.2 (*)    All other components within normal limits  RESPIRATORY PANEL BY PCR  OSMOLALITY, URINE  SODIUM, URINE, RANDOM  OSMOLALITY  CREATININE, URINE, RANDOM  URINALYSIS, COMPLETE (UACMP) WITH MICROSCOPIC  INFLUENZA PANEL BY PCR (TYPE A & B)   ____________________________________________  EKG  Reviewed and entered by me at 1700 Heart rate 60 QRS 70 QTC 500 Normal sinus rhythm, left bundle branch block ____________________________________________  RADIOLOGY    Chest x-ray reviewed, cardiomegaly, interstitial edema. ____________________________________________   PROCEDURES  Procedure(s) performed: None  Procedures  Critical Care performed: No  ____________________________________________   INITIAL IMPRESSION / ASSESSMENT AND PLAN / ED COURSE  Pertinent labs & imaging results that were available during my care of the patient were reviewed by me and considered in my medical decision making (see chart for details).  Patient resents for evaluation of primarily upper respiratory symptoms with cough and sinus discomfort.  Likely viral in etiology, will send influenza panel.  Chest x-ray does not demonstrate acute infiltrate.  Lab work however does show significant hyponatremia, possibly due to mild  dehydration though her exam seem to suggest primarily euvolemic.  I have not ordered any fluids on her, but will admit her to the hospital and discussed the case with Dr. Fonnie Birkenhead will further evaluate.  At this point, I will send labs to check a FeNA to help guide therapy and treatment.  Denies cardiac symptoms.  Appears consistent likely with upper respiratory infection, likely viral, possibly dehydration leading to hyponatremia.  She is alert, well-oriented, no history of seizure, no evidence of acute neurologic symptoms other than fatigue.      ____________________________________________   FINAL CLINICAL IMPRESSION(S) / ED DIAGNOSES  Final diagnoses:  Acute hyponatremia  General weakness  Acute non-recurrent maxillary sinusitis  Viral URI with cough      NEW MEDICATIONS STARTED DURING THIS VISIT:  New Prescriptions   No medications on file     Note:  This document was prepared using Dragon voice recognition software and may include unintentional dictation errors.     Sharyn Creamer, MD 06/25/17 2223

## 2017-06-26 LAB — RESPIRATORY PANEL BY PCR
ADENOVIRUS-RVPPCR: NOT DETECTED
Bordetella pertussis: NOT DETECTED
CHLAMYDOPHILA PNEUMONIAE-RVPPCR: NOT DETECTED
CORONAVIRUS HKU1-RVPPCR: NOT DETECTED
CORONAVIRUS NL63-RVPPCR: NOT DETECTED
Coronavirus 229E: NOT DETECTED
Coronavirus OC43: NOT DETECTED
Influenza A: NOT DETECTED
Influenza B: NOT DETECTED
MYCOPLASMA PNEUMONIAE-RVPPCR: NOT DETECTED
Metapneumovirus: NOT DETECTED
PARAINFLUENZA VIRUS 3-RVPPCR: NOT DETECTED
Parainfluenza Virus 1: NOT DETECTED
Parainfluenza Virus 2: NOT DETECTED
Parainfluenza Virus 4: NOT DETECTED
RHINOVIRUS / ENTEROVIRUS - RVPPCR: DETECTED — AB
Respiratory Syncytial Virus: NOT DETECTED

## 2017-06-26 LAB — BASIC METABOLIC PANEL
Anion gap: 12 (ref 5–15)
BUN: 12 mg/dL (ref 6–20)
CALCIUM: 8.2 mg/dL — AB (ref 8.9–10.3)
CO2: 20 mmol/L — ABNORMAL LOW (ref 22–32)
CREATININE: 0.72 mg/dL (ref 0.44–1.00)
Chloride: 94 mmol/L — ABNORMAL LOW (ref 101–111)
GFR calc Af Amer: >60 mL/min (ref >60)
Glucose, Bld: 208 mg/dL — ABNORMAL HIGH (ref 65–99)
Potassium: 4.1 mmol/L (ref 3.5–5.1)
SODIUM: 126 mmol/L — AB (ref 135–145)

## 2017-06-26 LAB — CBC
HCT: 40.8 % (ref 35.0–47.0)
Hemoglobin: 14.3 g/dL (ref 12.0–16.0)
MCH: 34.4 pg — ABNORMAL HIGH (ref 26.0–34.0)
MCHC: 35 g/dL (ref 32.0–36.0)
MCV: 98.4 fL (ref 80.0–100.0)
PLATELETS: 308 10*3/uL (ref 150–440)
RBC: 4.15 MIL/uL (ref 3.80–5.20)
RDW: 13.2 % (ref 11.5–14.5)
WBC: 8.1 10*3/uL (ref 3.6–11.0)

## 2017-06-26 MED ORDER — SODIUM CHLORIDE 1 G PO TABS
1.0000 g | ORAL_TABLET | Freq: Two times a day (BID) | ORAL | Status: DC
  Administered 2017-06-26 – 2017-06-28 (×4): 1 g via ORAL
  Filled 2017-06-26 (×5): qty 1

## 2017-06-26 MED ORDER — GUAIFENESIN-DM 100-10 MG/5ML PO SYRP
5.0000 mL | ORAL_SOLUTION | ORAL | Status: DC | PRN
  Administered 2017-06-26 – 2017-06-27 (×4): 5 mL via ORAL
  Filled 2017-06-26 (×5): qty 5

## 2017-06-26 MED ORDER — GUAIFENESIN-DM 100-10 MG/5ML PO SYRP: 5.0000 mL | ORAL_SOLUTION | ORAL | Status: DC | PRN

## 2017-06-26 NOTE — Progress Notes (Signed)
[facility] - [place] at [facility]   PATIENT NAME: Rhonda Skinner    MR#:  [id number]  DATE OF BIRTH:  Mar 06, 1946  SUBJECTIVE:  CHIEF COMPLAINT: Patient is feeling better than yesterday but still coughing  REVIEW OF SYSTEMS:  CONSTITUTIONAL: No fever, fatigue or weakness.  EYES: No blurred or double vision.  EARS, NOSE, AND THROAT: No tinnitus or ear pain.  RESPIRATORY:  reports improving cough, shortness of breath, denies wheezing or hemoptysis.  CARDIOVASCULAR: No chest pain, orthopnea, edema.  GASTROINTESTINAL: No nausea, vomiting, diarrhea or abdominal pain.  GENITOURINARY: No dysuria, hematuria.  ENDOCRINE: No polyuria, nocturia,  HEMATOLOGY: No anemia, easy bruising or bleeding SKIN: No rash or lesion. MUSCULOSKELETAL: No joint pain or arthritis.   NEUROLOGIC: No tingling, numbness, weakness.  PSYCHIATRY: No anxiety or depression.   DRUG ALLERGIES:   Allergies  Allergen Reactions  . Oxycodone   . Percocet [Oxycodone-Acetaminophen]   . Phenobarbital   . Vasotec [Enalapril Maleate]     VITALS:  Blood pressure (!) 143/51, pulse 78, temperature 98.6 F (37 C), temperature source Oral, resp. rate 18, height 5\' 2"  (1.575 m), weight 67.9 kg (149 lb 11.1 oz), SpO2 90 %.  PHYSICAL EXAMINATION:  GENERAL:  72 y.o.-year-old patient lying in the bed with no acute distress.  EYES: Pupils equal, round, reactive to light and accommodation. No scleral icterus. Extraocular muscles intact.  HEENT: Head atraumatic, normocephalic. Oropharynx and nasopharynx clear.  NECK:  Supple, no jugular venous distention. No thyroid enlargement, no tenderness.  LUNGS: Moderate breath sounds bilaterally, no wheezing, rales,rhonchi or crepitation. No use of accessory muscles of respiration.  CARDIOVASCULAR: S1, S2 normal. No murmurs, rubs, or gallops.  ABDOMEN: Soft, nontender, nondistended. Bowel sounds present. EXTREMITIES: No pedal edema, cyanosis, or clubbing.   NEUROLOGIC: Cranial nerves II through XII are intact. Muscle strength 5/5 in all extremities. Sensation intact. Gait not checked.  PSYCHIATRIC: The patient is alert and oriented x 3.  SKIN: No obvious rash, lesion, or ulcer.    LABORATORY PANEL:   CBC Recent Labs  Lab 06/26/17 0543  WBC 8.1  HGB 14.3  HCT 40.8  PLT 308   ------------------------------------------------------------------------------------------------------------------  Chemistries  Recent Labs  Lab 06/25/17 1024 06/26/17 0543  NA 123* 126*  K 4.2 4.1  CL 91* 94*  CO2 22 20*  GLUCOSE 135* 208*  BUN 6 12  CREATININE 0.62 0.72  CALCIUM 8.6* 8.2*  AST 28  --   ALT 34  --   ALKPHOS 88  --   BILITOT 0.6  --    ------------------------------------------------------------------------------------------------------------------  Cardiac Enzymes No results for input(s): TROPONINI in the last 168 hours. ------------------------------------------------------------------------------------------------------------------  RADIOLOGY:  Dg Chest 2 View  Result Date: 06/25/2017 CLINICAL DATA:  Cough for the past 2 weeks. EXAM: CHEST - 2 VIEW COMPARISON:  Chest x-ray dated July 13, 2007. FINDINGS: Unchanged right chest wall pacemaker. Stable cardiomegaly. Mild pulmonary vascular congestion and interstitial edema. New blunting of the right costophrenic angle with atelectasis/scarring at the right lung base. Unchanged blunting of the left costophrenic angle. No focal consolidation, pleural effusion, or pneumothorax. No acute osseous abnormality. IMPRESSION: 1. Cardiomegaly with mild vascular congestion and interstitial edema. Electronically Signed   By: Obie Dredge M.D.   On: 06/25/2017 11:26    EKG:   Orders placed or performed during the hospital encounter of 06/25/17  . EKG 12-Lead  . EKG 12-Lead  . EKG 12-Lead  . EKG 12-Lead    ASSESSMENT AND PLAN:  1.  Acute bronchitis with asthmatic component.   Respiratory panel is positive for rhinovirus Patient clinically is feeling better , continue supportive treatment  Solu-Medrol, DuoNeb and budesonide nebulizers.  2.  Hyponatremia.    Improving sodium trended up from 123-126 Gentle IV fluid hydration. Hold spironolactone  3.  History of cardiomyopathy and systolic congestive heart failure.  Watch closely with IV fluids.  4.  Hyperlipidemia unspecified on pravastatin  5.  Depression continue psychiatric medications  6. Essential Hypertension- continue usual meds       All the records are reviewed and case discussed with Care Management/Social Workerr. Management plans discussed with the patient, family and they are in agreement.  CODE STATUS: fc   TOTAL TIME TAKING CARE OF THIS PATIENT: 35  minutes.   POSSIBLE D/C IN 1-2 DAYS, DEPENDING ON CLINICAL CONDITION.  Note: This dictation was prepared with Dragon dictation along with smaller phrase technology. Any transcriptional errors that result from this process are unintentional.   Ramonita LabAruna Latrell Potempa M.D on 06/26/2017 at 1:50 PM  Between 7am to 6pm - Pager - [phone] After 6pm go to [email or web address] - password [email or web address] [facility]  [facility] [place]  Office  [phone]  CC: Primary care physician; Leim FabryAldridge, Barbara, MD

## 2017-06-26 NOTE — Plan of Care (Signed)
  Clinical Measurements: Respiratory complications will improve 06/26/2017 1453 - Progressing by Garwin Brothershomas, Denvil Canning Lynn, RN  -PRN cough medication given for c/o cough with decrease; pt verbalized having tan sputum at times Clinical Measurements: Diagnostic test results will improve 06/26/2017 1453 - Progressing by Garwin Brothershomas, Verdis Koval Lynn, RN  06/25/17 Respiratory Panel results positive for rhinovirus/enterovirus; per Jason FilaSara Wall in infection control does not have to be on Droplet Precautions only children under 72 years of age Clinical Measurements: Will remain free from infection 06/26/2017 1453 - Progressing by Garwin Brothershomas, Melvenia Favela Lynn, RN  Pt continues to take oral antibiotics

## 2017-06-27 LAB — BASIC METABOLIC PANEL
ANION GAP: 9 (ref 5–15)
BUN: 15 mg/dL (ref 6–20)
CALCIUM: 7.8 mg/dL — AB (ref 8.9–10.3)
CHLORIDE: 99 mmol/L — AB (ref 101–111)
CO2: 22 mmol/L (ref 22–32)
Creatinine, Ser: 0.64 mg/dL (ref 0.44–1.00)
GFR calc non Af Amer: >60 mL/min (ref >60)
GLUCOSE: 201 mg/dL — AB (ref 65–99)
Potassium: 4.5 mmol/L (ref 3.5–5.1)
Sodium: 130 mmol/L — ABNORMAL LOW (ref 135–145)

## 2017-06-27 MED ORDER — TUBERCULIN PPD 5 UNIT/0.1ML ID SOLN
5.0000 [IU] | Freq: Once | INTRADERMAL | Status: DC
  Filled 2017-06-27: qty 0.1

## 2017-06-27 NOTE — Progress Notes (Signed)
[facility] - [place] at [place] [facility]   PATIENT NAME: Rhonda Skinner    MR#:  [id number]  DATE OF BIRTH:  03/28/1946  SUBJECTIVE:  CHIEF COMPLAINT: Patient is reporting exertional dyspnea with minimal exertion  REVIEW OF SYSTEMS:  CONSTITUTIONAL: No fever, fatigue or weakness.  EYES: No blurred or double vision.  EARS, NOSE, AND THROAT: No tinnitus or ear pain.  RESPIRATORY:  reports improving cough, shortness of breath, denies wheezing or hemoptysis.  CARDIOVASCULAR: No chest pain, orthopnea, edema.  GASTROINTESTINAL: No nausea, vomiting, diarrhea or abdominal pain.  GENITOURINARY: No dysuria, hematuria.  ENDOCRINE: No polyuria, nocturia,  HEMATOLOGY: No anemia, easy bruising or bleeding SKIN: No rash or lesion. MUSCULOSKELETAL: No joint pain or arthritis.   NEUROLOGIC: No tingling, numbness, weakness.  PSYCHIATRY: No anxiety or depression.   DRUG ALLERGIES:   Allergies  Allergen Reactions  . Oxycodone   . Percocet [Oxycodone-Acetaminophen]   . Phenobarbital   . Vasotec [Enalapril Maleate]     VITALS:  Blood pressure (!) 155/65, pulse 66, temperature 97.8 F (36.6 C), temperature source Oral, resp. rate 18, height 5\' 2"  (1.575 m), weight 68.8 kg (151 lb 11.2 oz), SpO2 92 %.  PHYSICAL EXAMINATION:  GENERAL:  72 y.o.-year-old patient lying in the bed with no acute distress.  EYES: Pupils equal, round, reactive to light and accommodation. No scleral icterus. Extraocular muscles intact.  HEENT: Head atraumatic, normocephalic. Oropharynx and nasopharynx clear.  NECK:  Supple, no jugular venous distention. No thyroid enlargement, no tenderness.  LUNGS: Moderate breath sounds bilaterally, no wheezing, rales,rhonchi or crepitation. No use of accessory muscles of respiration.  CARDIOVASCULAR: S1, S2 normal. No murmurs, rubs, or gallops.  ABDOMEN: Soft, nontender, nondistended. Bowel sounds present. EXTREMITIES: No pedal edema, cyanosis, or clubbing.   NEUROLOGIC: Cranial nerves II through XII are intact. Muscle strength 5/5 in all extremities. Sensation intact. Gait not checked.  PSYCHIATRIC: The patient is alert and oriented x 3.  SKIN: No obvious rash, lesion, or ulcer.    LABORATORY PANEL:   CBC Recent Labs  Lab 06/26/17 0543  WBC 8.1  HGB 14.3  HCT 40.8  PLT 308   ------------------------------------------------------------------------------------------------------------------  Chemistries  Recent Labs  Lab 06/25/17 1024  06/27/17 0435  NA 123*   < > 130*  K 4.2   < > 4.5  CL 91*   < > 99*  CO2 22   < > 22  GLUCOSE 135*   < > 201*  BUN 6   < > 15  CREATININE 0.62   < > 0.64  CALCIUM 8.6*   < > 7.8*  AST 28  --   --   ALT 34  --   --   ALKPHOS 88  --   --   BILITOT 0.6  --   --    < > = values in this interval not displayed.   ------------------------------------------------------------------------------------------------------------------  Cardiac Enzymes No results for input(s): TROPONINI in the last 168 hours. ------------------------------------------------------------------------------------------------------------------  RADIOLOGY:  No results found.  EKG:   Orders placed or performed during the hospital encounter of 06/25/17  . EKG 12-Lead  . EKG 12-Lead  . EKG 12-Lead  . EKG 12-Lead    ASSESSMENT AND PLAN:   1.  Acute bronchitis with possible superimposed bacterial bronchitis Respiratory panel is positive for rhinovirus Patient clinically is feeling better , continue supportive treatment  Solu-Medrol, DuoNeb and budesonide nebulizers.  Augmentin Is Out of bed as tolerated   2.  Hyponatremia.  Improving sodium trended up from 123-126 -130 Gentle IV fluid hydration. Hold spironolactone  3.  History of cardiomyopathy and systolic congestive heart failure.  Watch closely with IV fluids.  4.  Hyperlipidemia unspecified on pravastatin  5.  Depression continue psychiatric  medications  6. Essential Hypertension- continue usual meds   PT evaluation for generalized weakness    All the records are reviewed and case discussed with Care Management/Social Workerr. Management plans discussed with the patient, family and they are in agreement.  CODE STATUS: fc   TOTAL TIME TAKING CARE OF THIS PATIENT: 35  minutes.   POSSIBLE D/C IN 1-2 DAYS, DEPENDING ON CLINICAL CONDITION.  Note: This dictation was prepared with Dragon dictation along with smaller phrase technology. Any transcriptional errors that result from this process are unintentional.   Ramonita Lab M.D on 06/27/2017 at 3:26 PM  Between 7am to 6pm - Pager - [phone] After 6pm go to [email or web address] - password [email or web address] [facility]  [place] [facility]  Office  [phone]  CC: Primary care physician; Leim Fabry, MD

## 2017-06-27 NOTE — Evaluation (Signed)
Physical Therapy Evaluation Patient Details Name: Rhonda Skinner MRN: [id number] DOB: April 04, 1946 Today's Date: 06/27/2017   History of Present Illness  presented to ER secondary to cough, achiness, fatigue; admitted with acute bronchitis with asthmatic exacerbation, hyponatremia (currently 130)  Clinical Impression  Upon evaluation, patient alert and oriented; follows all commands, eager for OOB/gait efforts.  Bilat UE/LE strength and ROM grossly symmetrical and WFL; no focal weakness, pain reported.  Able to complete bed mobility indep (seated edge of bed upon arrival to session); sit/stand, basic transfers and gait (600') without assist device, sup/mod indep.  Guarded with initial gait efforts, but fluidity, mechanics and overall confidence of movement improved throughout distance.  Slow, but steady cadence (10' walk time, 9-10 seconds) with mild gait deviations during dynamic gait components, but self-corrects as needed without overt buckling, LOB or safety concern. Appears at baseline level of functional ability with no acute indication for skilled PT.  Will complete order at this time; please re-consult as medically appropriate.    Follow Up Recommendations No PT follow up    Equipment Recommendations       Recommendations for Other Services       Precautions / Restrictions Precautions Precautions: Fall Restrictions Weight Bearing Restrictions: No      Mobility  Bed Mobility Overal bed mobility: Modified Independent                Transfers Overall transfer level: Modified independent Equipment used: None                Ambulation/Gait Ambulation/Gait assistance: Supervision;Modified independent (Device/Increase time) Ambulation Distance (Feet): 600 Feet Assistive device: None   Gait velocity: 10' walk time, 9-10 seconds   General Gait Details: min cuing for trunk rotation, arm swing and improved cadence throughout gait distance; mechanics,  overall fluidity improved.  Completes dynamic gait components with very mild gait deviaitions (self corrects as needed)  Stairs            Wheelchair Mobility    Modified Rankin (Stroke Patients Only)       Balance Overall balance assessment: Needs assistance Sitting-balance support: No upper extremity supported;Feet supported Sitting balance-Leahy Scale: Normal     Standing balance support: No upper extremity supported Standing balance-Leahy Scale: Fair                               Pertinent Vitals/Pain Pain Assessment: No/denies pain    Home Living Family/patient expects to be discharged to:: Private residence Living Arrangements: Spouse/significant other;Children Available Help at Discharge: Family;Available 24 hours/day Type of Home: House Home Access: Stairs to enter Entrance Stairs-Rails: Right;Left;Can reach both Entrance Stairs-Number of Steps: 4 Home Layout: One level Home Equipment: None      Prior Function Level of Independence: Independent         Comments: Indep with ADls, household and community mobility; denies fall history     Hand Dominance        Extremity/Trunk Assessment   Upper Extremity Assessment Upper Extremity Assessment: Overall WFL for tasks assessed    Lower Extremity Assessment Lower Extremity Assessment: Overall WFL for tasks assessed(grossly at least 4+/5 throughout)       Communication   Communication: No difficulties  Cognition Arousal/Alertness: Awake/alert Behavior During Therapy: WFL for tasks assessed/performed Overall Cognitive Status: Within Functional Limits for tasks assessed  General Comments      Exercises     Assessment/Plan    PT Assessment Patent does not need any further PT services  PT Problem List         PT Treatment Interventions      PT Goals (Current goals can be found in the Care Plan section)  Acute Rehab PT  Goals Patient Stated Goal: to return home tomorrow PT Goal Formulation: All assessment and education complete, DC therapy Time For Goal Achievement: 06/27/17 Potential to Achieve Goals: Good    Frequency     Barriers to discharge        Co-evaluation               AM-PAC PT "6 Clicks" Daily Activity  Outcome Measure Difficulty turning over in bed (including adjusting bedclothes, sheets and blankets)?: None Difficulty moving from lying on back to sitting on the side of the bed? : None Difficulty sitting down on and standing up from a chair with arms (e.g., wheelchair, bedside commode, etc,.)?: None Help needed moving to and from a bed to chair (including a wheelchair)?: None Help needed walking in hospital room?: None Help needed climbing 3-5 steps with a railing? : None 6 Click Score: 24    End of Session Equipment Utilized During Treatment: Gait belt Activity Tolerance: Patient tolerated treatment well Patient left: in chair;with call bell/phone within reach;with chair alarm set Nurse Communication: Mobility status PT Visit Diagnosis: Muscle weakness (generalized) (M62.81)    Time: [id number] PT Time Calculation (min) (ACUTE ONLY): 18 min   Charges:   PT Evaluation $PT Eval Low Complexity: 1 Low     PT G Codes:       Kenidi Elenbaas H. Manson PasseyBrown, PT, DPT, NCS 06/27/17, 4:50 PM [phone]

## 2017-06-28 LAB — BASIC METABOLIC PANEL
ANION GAP: 6 (ref 5–15)
BUN: 15 mg/dL (ref 6–20)
CHLORIDE: 104 mmol/L (ref 101–111)
CO2: 23 mmol/L (ref 22–32)
Calcium: 8.1 mg/dL — ABNORMAL LOW (ref 8.9–10.3)
Creatinine, Ser: 0.57 mg/dL (ref 0.44–1.00)
GFR calc Af Amer: >60 mL/min (ref >60)
Glucose, Bld: 169 mg/dL — ABNORMAL HIGH (ref 65–99)
POTASSIUM: 4.2 mmol/L (ref 3.5–5.1)
SODIUM: 133 mmol/L — AB (ref 135–145)

## 2017-06-28 MED ORDER — ALBUTEROL SULFATE HFA 108 (90 BASE) MCG/ACT IN AERS
2.0000 | INHALATION_SPRAY | Freq: Four times a day (QID) | RESPIRATORY_TRACT | 0 refills | Status: DC | PRN
Start: 2017-06-28 — End: 2021-08-06

## 2017-06-28 MED ORDER — FUROSEMIDE 10 MG/ML IJ SOLN
40.0000 mg | Freq: Once | INTRAMUSCULAR | Status: AC
  Administered 2017-06-28: 40 mg via INTRAVENOUS
  Filled 2017-06-28: qty 4

## 2017-06-28 MED ORDER — PREDNISONE 10 MG PO TABS: 20.0000 mg | ORAL_TABLET | Freq: Every day | ORAL | 0 refills | Status: AC

## 2017-06-28 NOTE — Discharge Instructions (Signed)
Resume diet and activity as before ° ° °

## 2017-06-28 NOTE — Care Management Important Message (Signed)
Important Message  Patient Details  Name: Rhonda HerterJoanne Neathery Mundt MRN: [id number] Date of Birth: [birth year]   Medicare Important Message Given:  Yes  Signed IM notice given   Eber HongGreene, Neema Barreira R, RN 06/28/2017, 12:35 PM

## 2017-06-28 NOTE — Progress Notes (Signed)
MD order received to discharge pt home today; verbally reviewed AVS with pt; [facility] [place], [place]; no questions voiced at this time; pt discharged via wheelchair to the visitor's entrance

## 2017-07-06 NOTE — Discharge Summary (Signed)
[facility] [place] [facility]   PATIENT NAME: Rhonda Skinner    MR#:  [id number]  DATE OF BIRTH:  [birth year]  DATE OF ADMISSION:  06/25/2017 ADMITTING PHYSICIAN: Alford Highlandichard Wieting, MD  DATE OF DISCHARGE: 06/28/2017  1:42 PM  PRIMARY CARE PHYSICIAN: Leim FabryAldridge, Barbara, MD   ADMISSION DIAGNOSIS:  Acute hyponatremia [E87.1] General weakness [R53.1] Viral URI with cough [J06.9, B97.89] Acute non-recurrent maxillary sinusitis [J01.00]  DISCHARGE DIAGNOSIS:  Active Problems:   Bronchitis   SECONDARY DIAGNOSIS:   Past Medical History:  Diagnosis Date  . Anxiety   . Arthritis   . CHF (congestive heart failure) (HCC)   . Depression   . Heart murmur   . HOCM (hypertrophic obstructive cardiomyopathy) (HCC)   . Hypercholesteremia   . Hypertension   . Osteoarthritis   . Osteoporosis   . Psychiatric illness      ADMITTING HISTORY  HISTORY OF PRESENT ILLNESS:  Rhonda Skinner  is a 72 y.o. female that is not been feeling well for about a week.  She has been having sore throat and achiness and then a head cold.  She has been blowing her nose.  She has been coughing up brown bloody sputum.  She has been having sinus pain.  Her throat is been burning.  She is been feeling very tired and fatigued.  She has been dizzy.  She has been wheezing.  In the ER, she was found to have a low sodium.  She states that she thought she was eating okay but may not have been.  Hospitalist services were contacted for further evaluation  HOSPITAL COURSE:   *Acute bronchitis with respiratory panel showing rhinovirus.  Patient was treated with Solu-Medrol, duo nebs and nebulizers.  Patient improved well.  By the day of discharge her wheezing is almost resolved.  She feels close to normal.  Discharged home.  *Hyponatremia is chronic.  Stable.  *History of cardiomyopathy with systolic congestive heart failure is stable.  Patient is being discharged home with home health physical  therapy.  Follow-up with primary care physician within a week.  CONSULTS OBTAINED:    DRUG ALLERGIES:   Allergies  Allergen Reactions  . Oxycodone   . Percocet [Oxycodone-Acetaminophen]   . Phenobarbital   . Vasotec [Enalapril Maleate]     DISCHARGE MEDICATIONS:   Allergies as of 06/28/2017      Reactions   Oxycodone    Percocet [oxycodone-acetaminophen]    Phenobarbital    Vasotec [enalapril Maleate]       Medication List    TAKE these medications   acetaminophen 500 MG tablet Commonly known as:  TYLENOL Take 500 mg by mouth every 6 (six) hours as needed.   albuterol 108 (90 Base) MCG/ACT inhaler Commonly known as:  PROVENTIL HFA;VENTOLIN HFA Inhale 2 puffs into the lungs every 6 (six) hours as needed for wheezing or shortness of breath.   Biotin 5 MG Tabs Take 1 tablet by mouth daily.   busPIRone 15 MG tablet Commonly known as:  BUSPAR Take 15 mg by mouth 2 (two) times daily.   calcium-vitamin D 500-200 MG-UNIT tablet Commonly known as:  OSCAL WITH D Take 1 tablet by mouth.   carvedilol 25 MG tablet Commonly known as:  COREG Take 25 mg by mouth 2 (two) times daily with a meal.   D3 HIGH POTENCY 1000 units capsule Generic drug:  Cholecalciferol Take 1,000 Units by mouth daily.   desvenlafaxine 50 MG 24 hr tablet Commonly known as:  PRISTIQ Take 50 mg by mouth daily.   diltiazem 360 MG 24 hr capsule Commonly known as:  TIAZAC Take 360 mg by mouth daily.   ergocalciferol 50000 units capsule Commonly known as:  VITAMIN D2 Take 50,000 Units by mouth every 30 (thirty) days. On the first of every month   gabapentin 300 MG capsule Commonly known as:  NEURONTIN Take 1 capsule (300MG ) by mouth every morning and 2 capsules (600MG ) by mouth every evening   losartan 100 MG tablet Commonly known as:  COZAAR Take 100 mg by mouth daily.   multivitamin tablet Take 1 tablet by mouth daily.   pravastatin 80 MG tablet Commonly known as:  PRAVACHOL Take 80  mg by mouth daily.   ranitidine 150 MG capsule Commonly known as:  ZANTAC Take 150 mg by mouth at bedtime.   spironolactone 25 MG tablet Commonly known as:  ALDACTONE Take 25 mg by mouth daily.   tiZANidine 2 MG tablet Commonly known as:  ZANAFLEX Take  to 1 tablet by mouth three times daily as needed     ASK your doctor about these medications   predniSONE 10 MG tablet Commonly known as:  DELTASONE Take 2 tablets (20 mg total) by mouth daily for 3 days. Ask about: Should I take this medication?       Today   VITAL SIGNS:  Blood pressure (!) 158/57, pulse 65, temperature 97.8 F (36.6 C), temperature source Axillary, resp. rate 18, height 5\' 2"  (1.575 m), weight 69.8 kg (153 lb 14.4 oz), SpO2 97 %.  I/O:  No intake or output data in the 24 hours ending 07/06/17 1359  PHYSICAL EXAMINATION:  Physical Exam  GENERAL:  72 y.o.-year-old patient lying in the bed with no acute distress.  LUNGS: Normal breath sounds bilaterally, no wheezing, rales,rhonchi or crepitation. No use of accessory muscles of respiration.  CARDIOVASCULAR: S1, S2 normal. No murmurs, rubs, or gallops.  ABDOMEN: Soft, non-tender, non-distended. Bowel sounds present. No organomegaly or mass.  NEUROLOGIC: Moves all 4 extremities. PSYCHIATRIC: The patient is alert and oriented x 3.  SKIN: No obvious rash, lesion, or ulcer.   DATA REVIEW:   CBC No results for input(s): WBC, HGB, HCT, PLT in the last 168 hours.  Chemistries  No results for input(s): NA, K, CL, CO2, GLUCOSE, BUN, CREATININE, CALCIUM, MG, AST, ALT, ALKPHOS, BILITOT in the last 168 hours.  Invalid input(s): GFRCGP  Cardiac Enzymes No results for input(s): TROPONINI in the last 168 hours.  Microbiology Results  Results for orders placed or performed during the hospital encounter of 06/25/17  Respiratory Panel by PCR     Status: Abnormal   Collection Time: 06/25/17  4:10 PM  Result Value Ref Range Status   Adenovirus NOT DETECTED NOT  DETECTED Final   Coronavirus 229E NOT DETECTED NOT DETECTED Final   Coronavirus HKU1 NOT DETECTED NOT DETECTED Final   Coronavirus NL63 NOT DETECTED NOT DETECTED Final   Coronavirus OC43 NOT DETECTED NOT DETECTED Final   Metapneumovirus NOT DETECTED NOT DETECTED Final   Rhinovirus / Enterovirus DETECTED (A) NOT DETECTED Final   Influenza A NOT DETECTED NOT DETECTED Final   Influenza B NOT DETECTED NOT DETECTED Final   Parainfluenza Virus 1 NOT DETECTED NOT DETECTED Final   Parainfluenza Virus 2 NOT DETECTED NOT DETECTED Final   Parainfluenza Virus 3 NOT DETECTED NOT DETECTED Final   Parainfluenza Virus 4 NOT DETECTED NOT DETECTED Final   Respiratory Syncytial Virus NOT DETECTED NOT DETECTED Final  Bordetella pertussis NOT DETECTED NOT DETECTED Final   Chlamydophila pneumoniae NOT DETECTED NOT DETECTED Final   Mycoplasma pneumoniae NOT DETECTED NOT DETECTED Final    Comment: Performed at [facility], [address], [place], [place] [address]    RADIOLOGY:  No results found.  Follow up with PCP in 1 week.  Management plans discussed with the patient, family and they are in agreement.  CODE STATUS:  Code Status History    Date Active Date Inactive Code Status Order ID Comments User Context   06/25/2017 1615 06/28/2017 1643 Full Code [id number]  Alford Highland, MD ED    Advance Directive Documentation     Most Recent Value  Type of Advance Directive  Healthcare Power of Attorney  Pre-existing out of facility DNR order (yellow form or pink MOST form)  -  "MOST" Form in Place?  -      TOTAL TIME TAKING CARE OF THIS PATIENT ON DAY OF DISCHARGE: more than 30 minutes.   Molinda Bailiff Louis Ivery M.D on 07/06/2017 at 1:59 PM  Between 7am to 6pm - Pager - [phone]  After 6pm go to [email or web address] - password [email or web address]  [facility]  Office  [phone]  CC: Primary care physician; Leim Fabry, MD  Note: This dictation was prepared with Dragon dictation  along with smaller phrase technology. Any transcriptional errors that result from this process are unintentional.

## 2017-07-07 ENCOUNTER — Telehealth: Payer: Self-pay | Admitting: Licensed Clinical Social Worker

## 2017-07-07 NOTE — Telephone Encounter (Signed)
EMMI flagged patient for answering yes to loss of interest in things. Clinical Child psychotherapistocial Worker (CSW) was able to reach patient via telephone. Patient reported that she is fine and not depressed. Patient reported that she hit yes by mistake. Patient reported that she is slowing gaining her strength back after her hospitalization and went to her PCP last week. Patient reported no needs or concerns. No future call is needed.   [employer] Hughes IncorporatedBailey Lonna Rabold, LCSW [phone]

## 2019-09-09 IMAGING — CR DG HAND COMPLETE 3+V*L*
3 series · 4 of 4 positions shown · non-contrast
Comparison: None.

CLINICAL DATA: 71-year-old female status post fall on outstretched
hand. Pain radiating to the base of the left thumb and wrist.

EXAM:
LEFT HAND - COMPLETE 3+ VIEW

[hand ap]
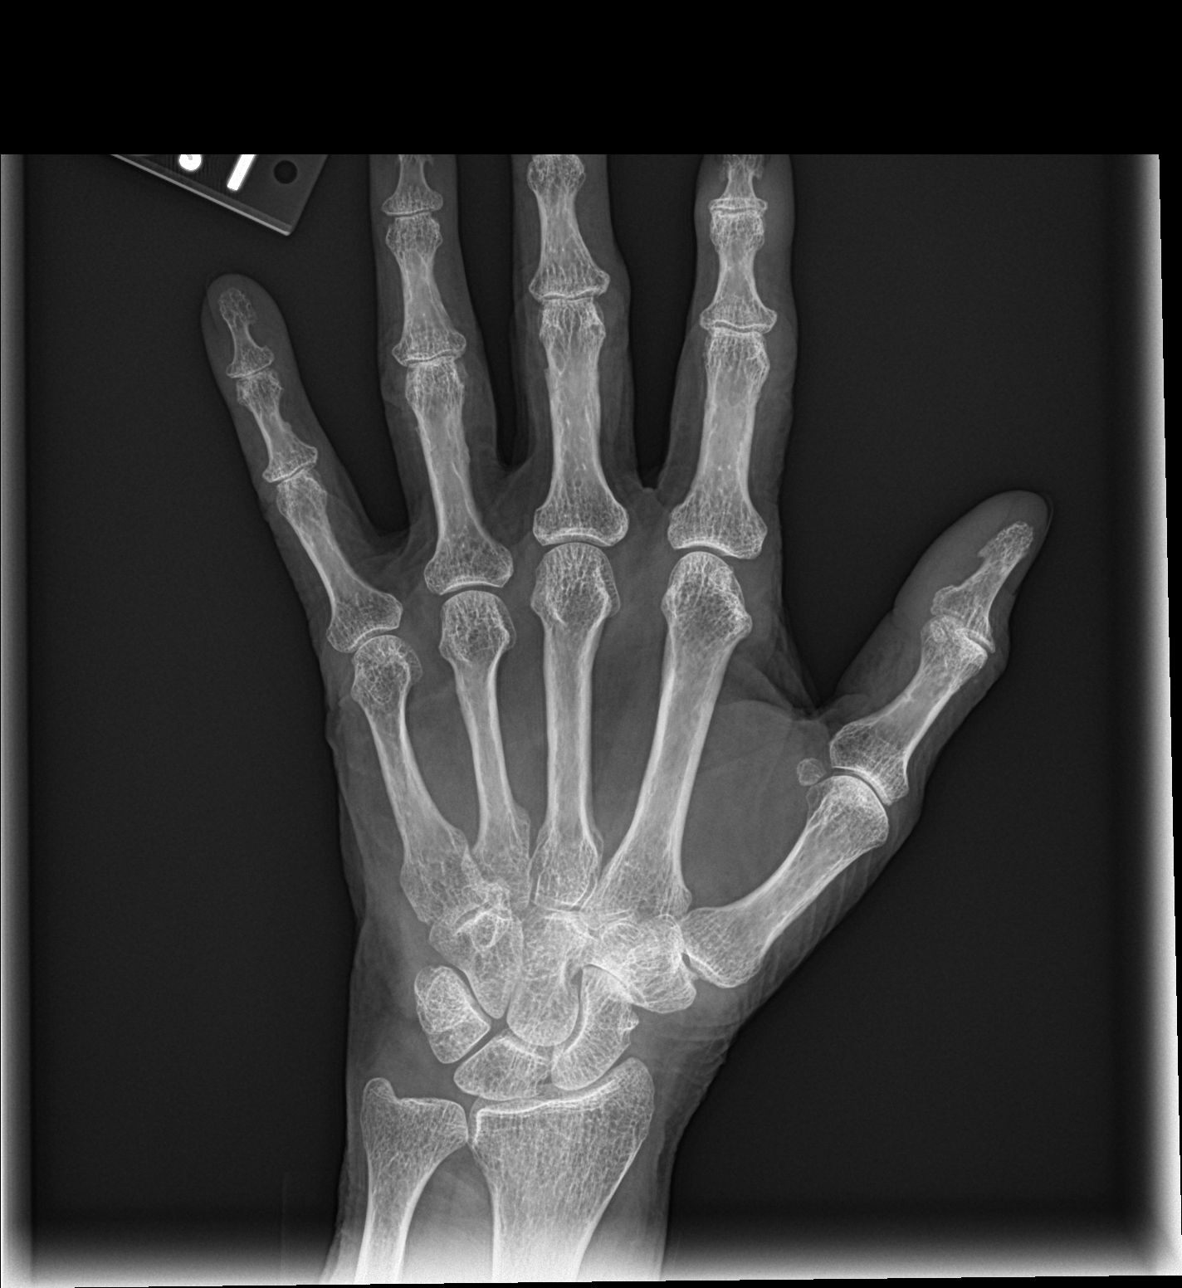

[hand obl]
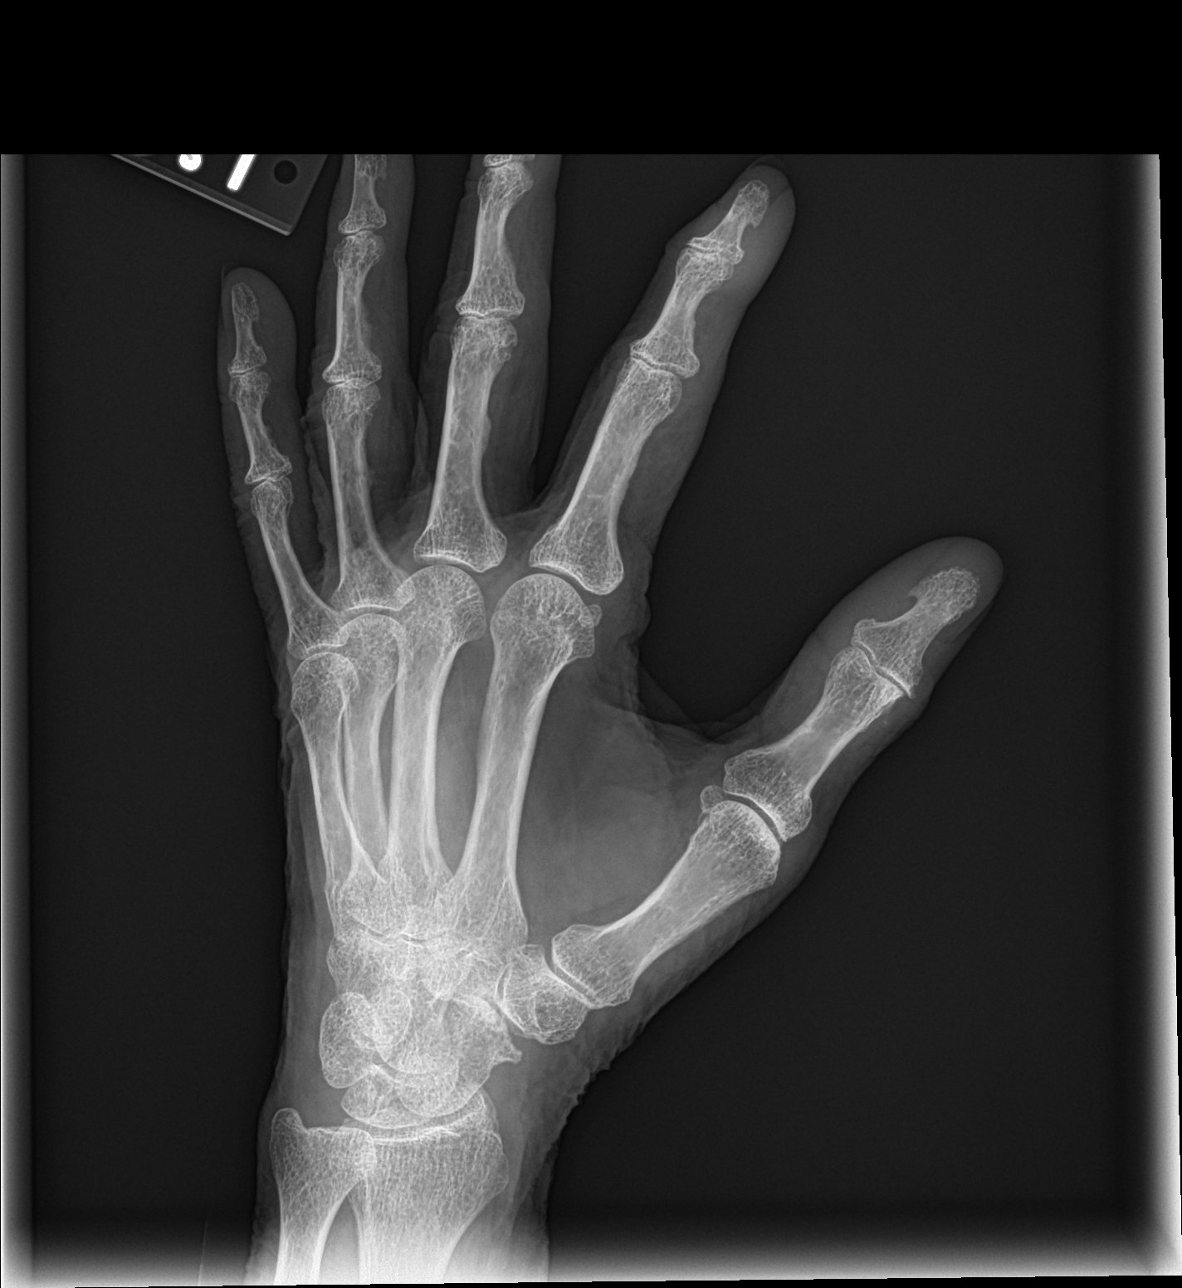

[Series 3: hand lat · 0.14mm/px · 2 of 2 slices shown]
[im 1/2]
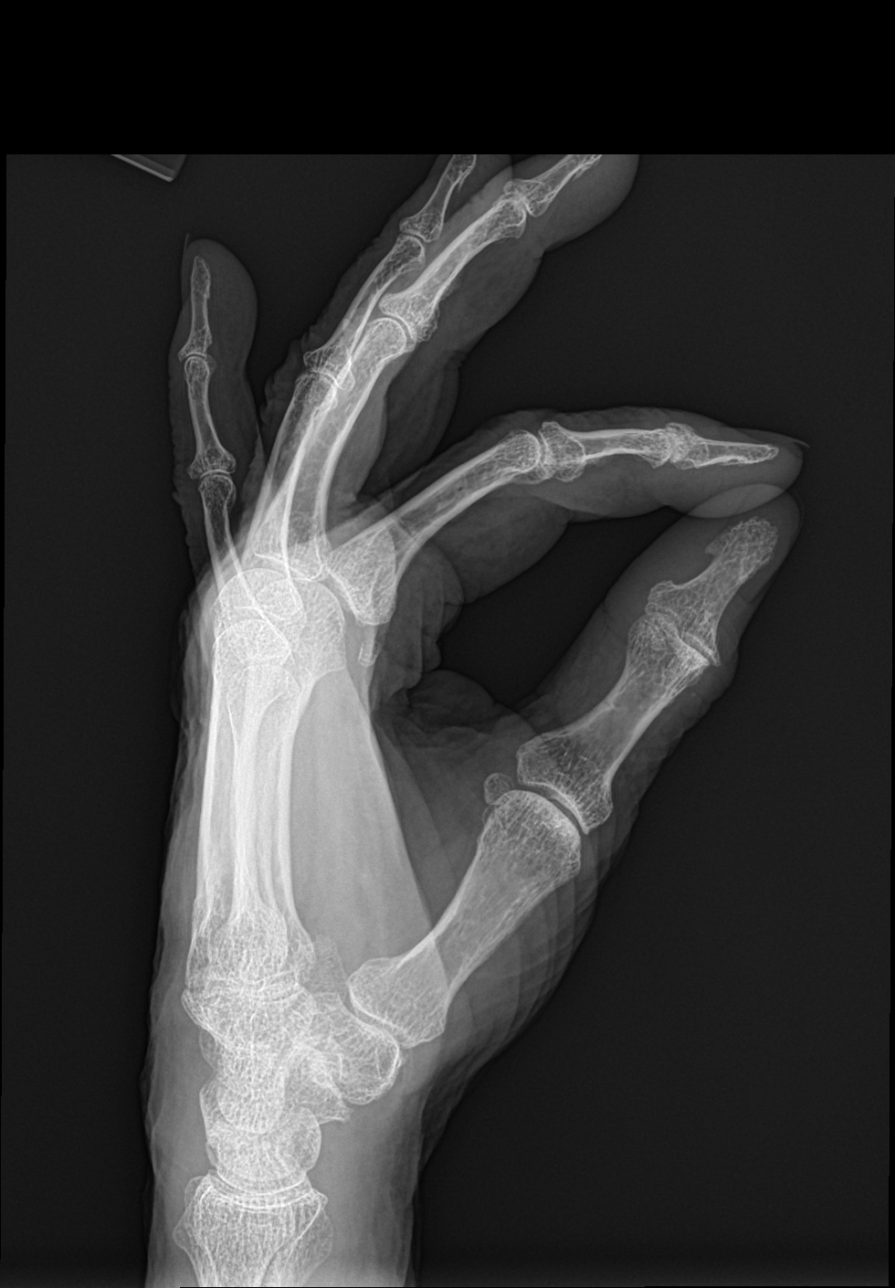
[im 2/2]
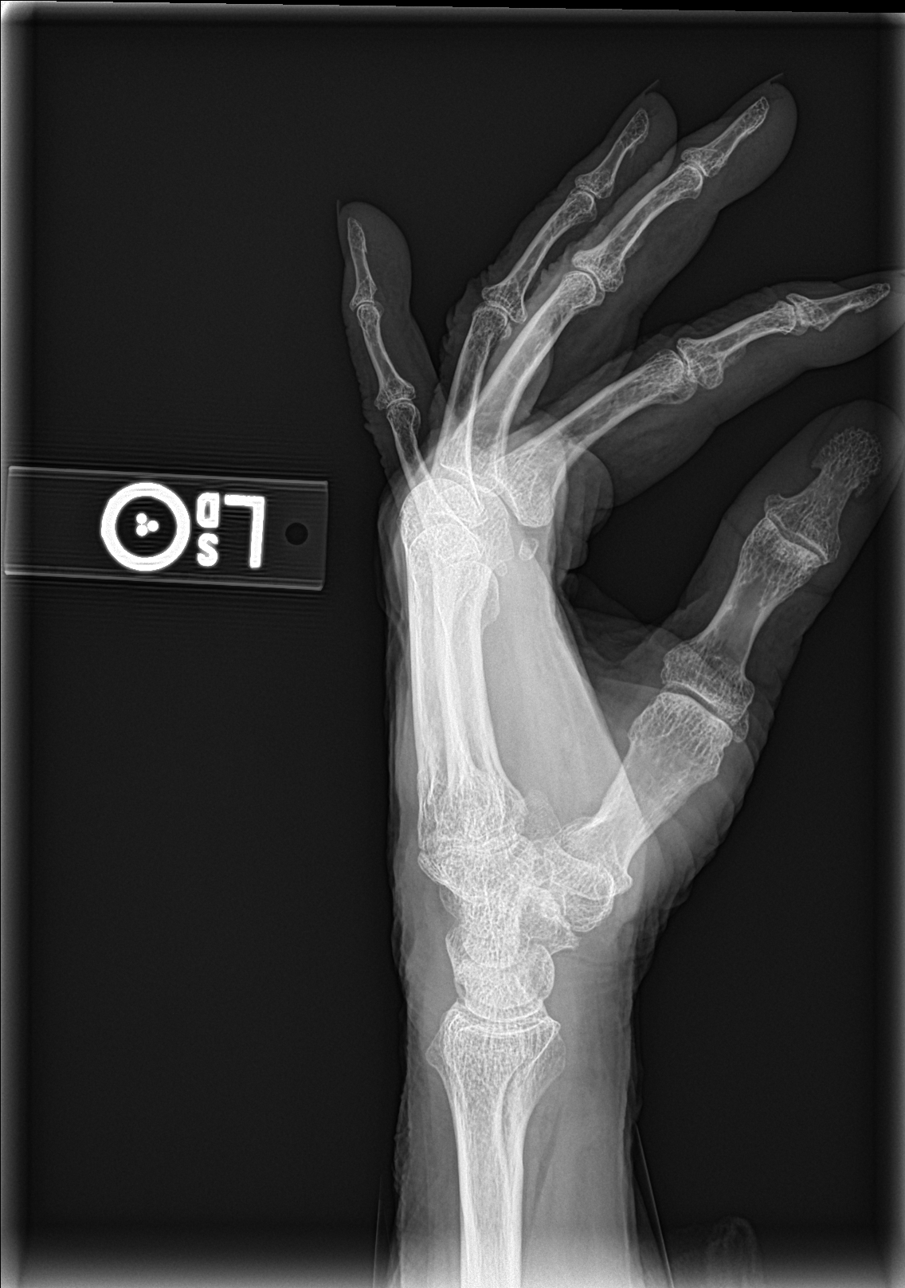

[4 of 4 positions shown; findings below may reference images not displayed]

FINDINGS: Distal radius and ulna appear intact. Carpal bone alignment and
joint spaces within normal limits. The basal joint of the left thumb
appears normal for age. The left thumb metacarpal and phalanges
appear intact and normally aligned. Other metacarpals and phalanges
appear intact. No acute osseous abnormality identified.
IMPRESSION: No acute fracture or dislocation identified about the left hand.

## 2019-09-09 IMAGING — CR DG WRIST COMPLETE 3+V*L*
4 series · 4 of 4 positions shown · non-contrast
Comparison: Left hand series today reported separately.

CLINICAL DATA: 71-year-old female status post fall on outstretched
hand. Pain radiating to the base of the left thumb and wrist.

EXAM:
LEFT WRIST - COMPLETE 3+ VIEW

[wrist pa]
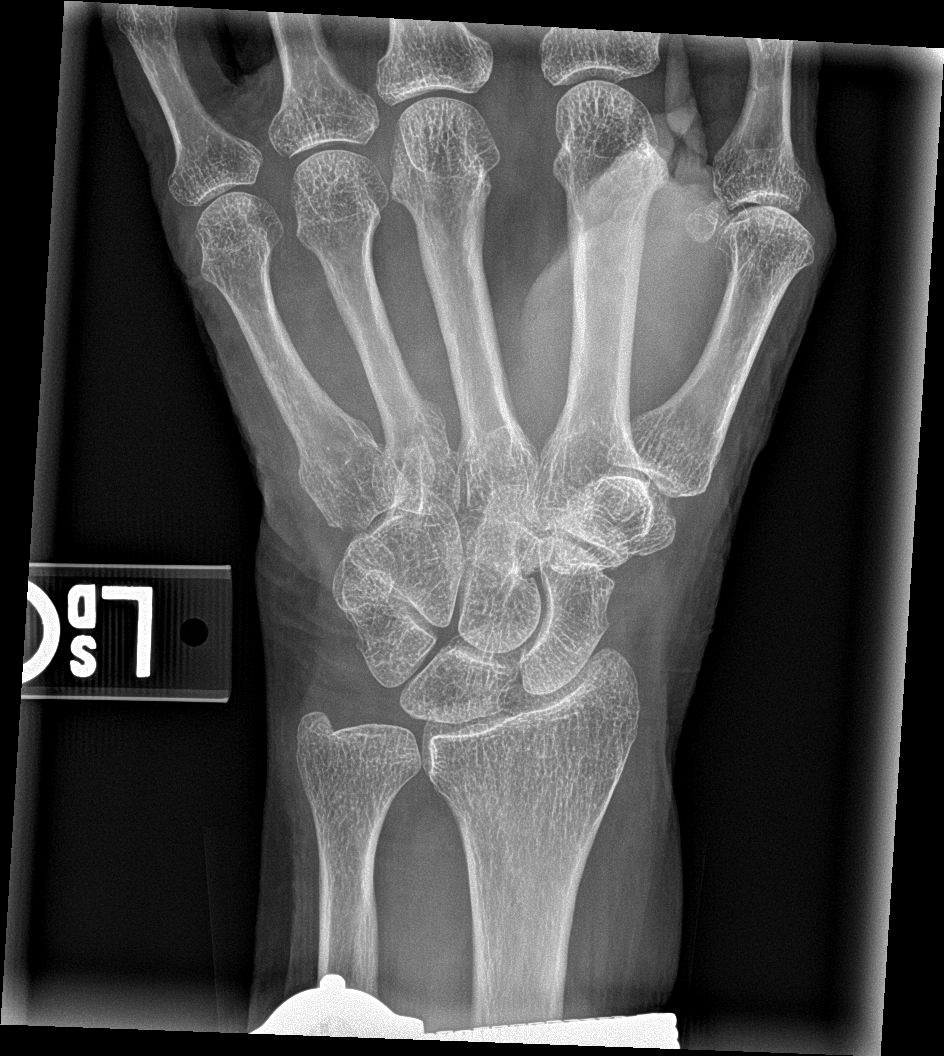

[wrist obl]
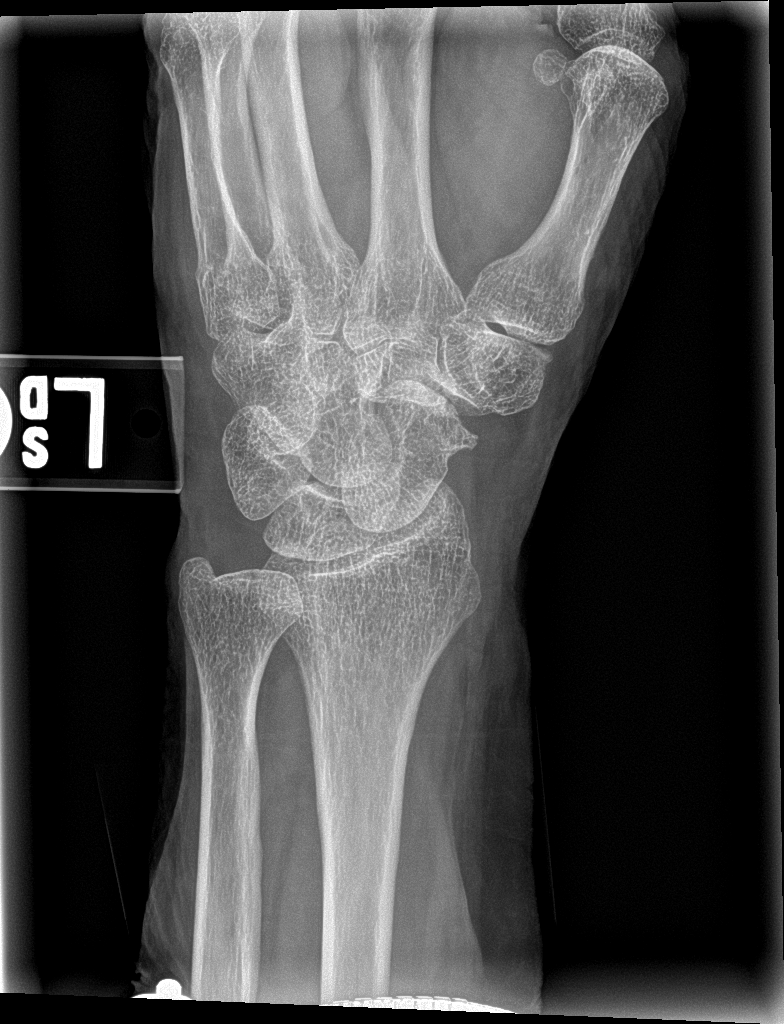

[wrist lat]
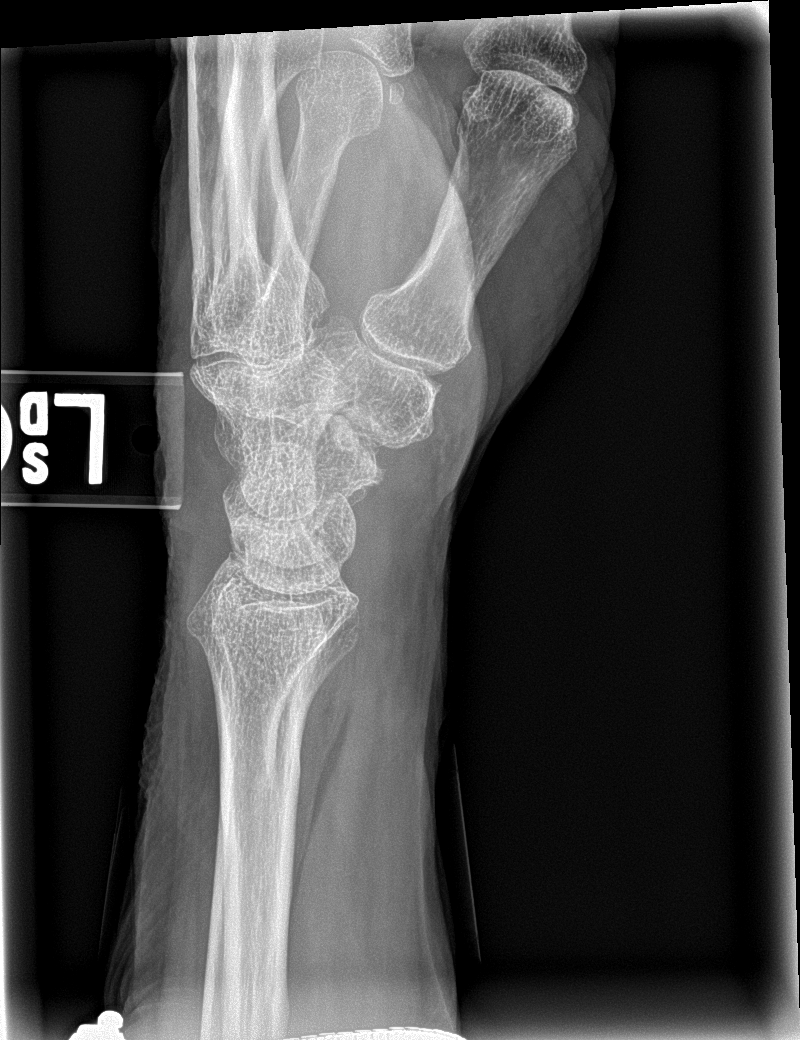

[wrist navicular]
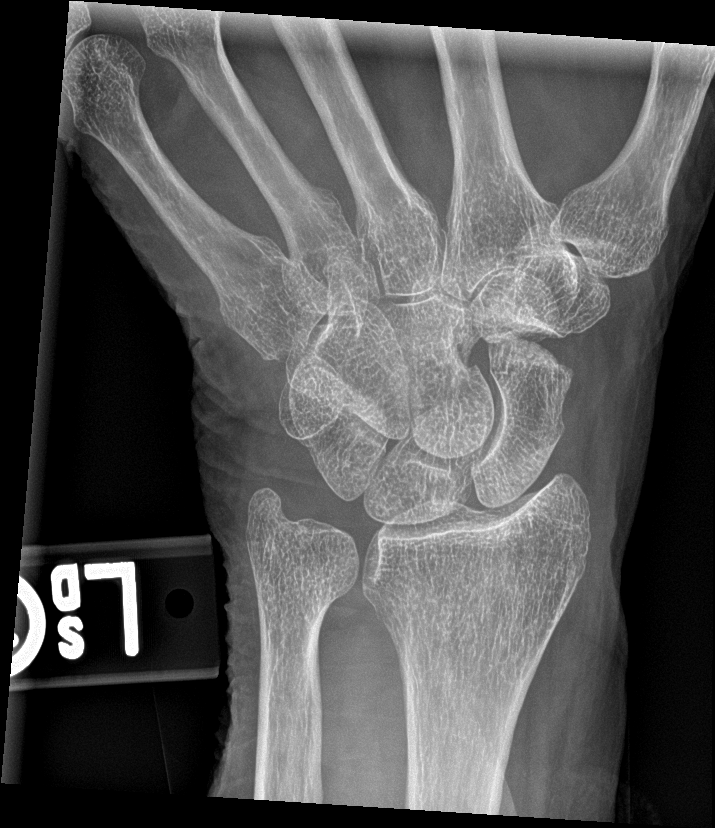

[4 of 4 positions shown; findings below may reference images not displayed]

FINDINGS: Mild osteopenia. The distal left radius and ulna appear intact.
Carpal bone alignment and joint spaces are within normal limits. No
scaphoid fracture identified. The carpometacarpal joint spaces are
normal for age. The visible metacarpals appear intact.
IMPRESSION: No acute fracture or dislocation identified about the left wrist.

Osteopenia, consider follow-up radiographs if symptoms persist.

## 2019-12-13 ENCOUNTER — Other Ambulatory Visit: Payer: Self-pay | Admitting: Family Medicine

## 2019-12-13 DIAGNOSIS — Z1231 Encounter for screening mammogram for malignant neoplasm of breast: Secondary | ICD-10-CM

## 2019-12-21 ENCOUNTER — Other Ambulatory Visit: Payer: Self-pay

## 2019-12-21 ENCOUNTER — Ambulatory Visit
Admission: RE | Admit: 2019-12-21 | Discharge: 2019-12-21 | Disposition: A | Discharge status: Home / Self Care | Payer: Medicare PPO | Source: Ambulatory Visit | Attending: Family Medicine | Admitting: Family Medicine

## 2019-12-21 DIAGNOSIS — Z1231 Encounter for screening mammogram for malignant neoplasm of breast: Secondary | ICD-10-CM | POA: Diagnosis not present

## 2019-12-27 ENCOUNTER — Other Ambulatory Visit: Payer: Self-pay | Admitting: *Deleted

## 2019-12-27 ENCOUNTER — Inpatient Hospital Stay
Admission: RE | Admit: 2019-12-27 | Discharge: 2019-12-27 | Disposition: A | Discharge status: Home / Self Care | Payer: Self-pay | Source: Ambulatory Visit | Attending: *Deleted | Admitting: *Deleted

## 2019-12-27 DIAGNOSIS — Z1231 Encounter for screening mammogram for malignant neoplasm of breast: Secondary | ICD-10-CM

## 2020-09-14 DIAGNOSIS — Z45018 Encounter for adjustment and management of other part of cardiac pacemaker: Secondary | ICD-10-CM | POA: Diagnosis not present

## 2020-10-23 DIAGNOSIS — G4700 Insomnia, unspecified: Secondary | ICD-10-CM | POA: Diagnosis not present

## 2021-08-06 ENCOUNTER — Other Ambulatory Visit: Payer: Self-pay

## 2021-08-06 ENCOUNTER — Inpatient Hospital Stay
Admission: EM | Admit: 2021-08-06 | Discharge: 2021-09-13 | DRG: 335 | Disposition: E | Payer: Medicare PPO | Attending: Internal Medicine | Admitting: Internal Medicine

## 2021-08-06 ENCOUNTER — Encounter: Payer: Self-pay | Admitting: Emergency Medicine

## 2021-08-06 ENCOUNTER — Emergency Department: Payer: Medicare PPO

## 2021-08-06 DIAGNOSIS — I959 Hypotension, unspecified: Secondary | ICD-10-CM | POA: Diagnosis not present

## 2021-08-06 DIAGNOSIS — Z950 Presence of cardiac pacemaker: Secondary | ICD-10-CM

## 2021-08-06 DIAGNOSIS — N183 Chronic kidney disease, stage 3 unspecified: Secondary | ICD-10-CM | POA: Diagnosis present

## 2021-08-06 DIAGNOSIS — I4892 Unspecified atrial flutter: Secondary | ICD-10-CM

## 2021-08-06 DIAGNOSIS — R7401 Elevation of levels of liver transaminase levels: Secondary | ICD-10-CM

## 2021-08-06 DIAGNOSIS — I5032 Chronic diastolic (congestive) heart failure: Secondary | ICD-10-CM | POA: Diagnosis present

## 2021-08-06 DIAGNOSIS — A419 Sepsis, unspecified organism: Secondary | ICD-10-CM | POA: Diagnosis not present

## 2021-08-06 DIAGNOSIS — J9602 Acute respiratory failure with hypercapnia: Secondary | ICD-10-CM | POA: Diagnosis not present

## 2021-08-06 DIAGNOSIS — I452 Bifascicular block: Secondary | ICD-10-CM | POA: Diagnosis present

## 2021-08-06 DIAGNOSIS — Z66 Do not resuscitate: Secondary | ICD-10-CM | POA: Diagnosis not present

## 2021-08-06 DIAGNOSIS — E785 Hyperlipidemia, unspecified: Secondary | ICD-10-CM | POA: Diagnosis not present

## 2021-08-06 DIAGNOSIS — F39 Unspecified mood [affective] disorder: Secondary | ICD-10-CM | POA: Diagnosis present

## 2021-08-06 DIAGNOSIS — I421 Obstructive hypertrophic cardiomyopathy: Secondary | ICD-10-CM | POA: Diagnosis present

## 2021-08-06 DIAGNOSIS — Z79899 Other long term (current) drug therapy: Secondary | ICD-10-CM

## 2021-08-06 DIAGNOSIS — F32A Depression, unspecified: Secondary | ICD-10-CM | POA: Diagnosis present

## 2021-08-06 DIAGNOSIS — Z90710 Acquired absence of both cervix and uterus: Secondary | ICD-10-CM

## 2021-08-06 DIAGNOSIS — E1122 Type 2 diabetes mellitus with diabetic chronic kidney disease: Secondary | ICD-10-CM | POA: Diagnosis present

## 2021-08-06 DIAGNOSIS — R570 Cardiogenic shock: Secondary | ICD-10-CM | POA: Diagnosis not present

## 2021-08-06 DIAGNOSIS — I2699 Other pulmonary embolism without acute cor pulmonale: Secondary | ICD-10-CM

## 2021-08-06 DIAGNOSIS — E875 Hyperkalemia: Secondary | ICD-10-CM | POA: Diagnosis present

## 2021-08-06 DIAGNOSIS — J9601 Acute respiratory failure with hypoxia: Secondary | ICD-10-CM

## 2021-08-06 DIAGNOSIS — M810 Age-related osteoporosis without current pathological fracture: Secondary | ICD-10-CM | POA: Diagnosis present

## 2021-08-06 DIAGNOSIS — I3139 Other pericardial effusion (noninflammatory): Secondary | ICD-10-CM | POA: Diagnosis present

## 2021-08-06 DIAGNOSIS — G928 Other toxic encephalopathy: Secondary | ICD-10-CM | POA: Diagnosis not present

## 2021-08-06 DIAGNOSIS — I130 Hypertensive heart and chronic kidney disease with heart failure and stage 1 through stage 4 chronic kidney disease, or unspecified chronic kidney disease: Secondary | ICD-10-CM | POA: Diagnosis present

## 2021-08-06 DIAGNOSIS — I5033 Acute on chronic diastolic (congestive) heart failure: Secondary | ICD-10-CM | POA: Diagnosis not present

## 2021-08-06 DIAGNOSIS — E1169 Type 2 diabetes mellitus with other specified complication: Secondary | ICD-10-CM | POA: Diagnosis present

## 2021-08-06 DIAGNOSIS — I10 Essential (primary) hypertension: Secondary | ICD-10-CM | POA: Diagnosis not present

## 2021-08-06 DIAGNOSIS — E871 Hypo-osmolality and hyponatremia: Secondary | ICD-10-CM | POA: Diagnosis present

## 2021-08-06 DIAGNOSIS — K56609 Unspecified intestinal obstruction, unspecified as to partial versus complete obstruction: Secondary | ICD-10-CM | POA: Diagnosis not present

## 2021-08-06 DIAGNOSIS — K5651 Intestinal adhesions [bands], with partial obstruction: Principal | ICD-10-CM | POA: Diagnosis present

## 2021-08-06 DIAGNOSIS — I480 Paroxysmal atrial fibrillation: Secondary | ICD-10-CM | POA: Diagnosis present

## 2021-08-06 DIAGNOSIS — I951 Orthostatic hypotension: Secondary | ICD-10-CM | POA: Diagnosis not present

## 2021-08-06 DIAGNOSIS — Z885 Allergy status to narcotic agent status: Secondary | ICD-10-CM

## 2021-08-06 DIAGNOSIS — K567 Ileus, unspecified: Secondary | ICD-10-CM | POA: Diagnosis not present

## 2021-08-06 DIAGNOSIS — R531 Weakness: Secondary | ICD-10-CM | POA: Diagnosis not present

## 2021-08-06 DIAGNOSIS — R0602 Shortness of breath: Secondary | ICD-10-CM

## 2021-08-06 DIAGNOSIS — Z803 Family history of malignant neoplasm of breast: Secondary | ICD-10-CM

## 2021-08-06 DIAGNOSIS — B370 Candidal stomatitis: Secondary | ICD-10-CM | POA: Diagnosis not present

## 2021-08-06 DIAGNOSIS — R579 Shock, unspecified: Secondary | ICD-10-CM

## 2021-08-06 DIAGNOSIS — E7800 Pure hypercholesterolemia, unspecified: Secondary | ICD-10-CM | POA: Diagnosis present

## 2021-08-06 DIAGNOSIS — N179 Acute kidney failure, unspecified: Secondary | ICD-10-CM | POA: Diagnosis present

## 2021-08-06 DIAGNOSIS — B379 Candidiasis, unspecified: Secondary | ICD-10-CM | POA: Diagnosis not present

## 2021-08-06 DIAGNOSIS — I495 Sick sinus syndrome: Secondary | ICD-10-CM | POA: Diagnosis present

## 2021-08-06 LAB — COMPREHENSIVE METABOLIC PANEL
ALT: 28 U/L (ref 0–44)
AST: 31 U/L (ref 15–41)
Albumin: 4 g/dL (ref 3.5–5.0)
Alkaline Phosphatase: 77 U/L (ref 38–126)
Anion gap: 14 (ref 5–15)
BUN: 28 mg/dL — ABNORMAL HIGH (ref 8–23)
CO2: 21 mmol/L — ABNORMAL LOW (ref 22–32)
Calcium: 9.5 mg/dL (ref 8.9–10.3)
Chloride: 97 mmol/L — ABNORMAL LOW (ref 98–111)
Creatinine, Ser: 1.27 mg/dL — ABNORMAL HIGH (ref 0.44–1.00)
GFR, Estimated: 44 mL/min — ABNORMAL LOW (ref >60)
Glucose, Bld: 142 mg/dL — ABNORMAL HIGH (ref 70–99)
Potassium: 5.2 mmol/L — ABNORMAL HIGH (ref 3.5–5.1)
Sodium: 132 mmol/L — ABNORMAL LOW (ref 135–145)
Total Bilirubin: 1.2 mg/dL (ref 0.3–1.2)
Total Protein: 8 g/dL (ref 6.5–8.1)

## 2021-08-06 LAB — CBC
HCT: 45.8 % (ref 36.0–46.0)
Hemoglobin: 15.6 g/dL — ABNORMAL HIGH (ref 12.0–15.0)
MCH: 32.5 pg (ref 26.0–34.0)
MCHC: 34.1 g/dL (ref 30.0–36.0)
MCV: 95.4 fL (ref 80.0–100.0)
Platelets: 359 10*3/uL (ref 150–400)
RBC: 4.8 MIL/uL (ref 3.87–5.11)
RDW: 12.2 % (ref 11.5–15.5)
WBC: 13.5 10*3/uL — ABNORMAL HIGH (ref 4.0–10.5)
nRBC: 0 % (ref 0.0–0.2)

## 2021-08-06 LAB — LIPASE, BLOOD: Lipase: 29 U/L (ref 11–51)

## 2021-08-06 MED ORDER — ONDANSETRON HCL 4 MG/2ML IJ SOLN
4.0000 mg | Freq: Four times a day (QID) | INTRAMUSCULAR | Status: DC | PRN
  Administered 2021-08-06 – 2021-08-13 (×4): 4 mg via INTRAVENOUS
  Filled 2021-08-06 (×6): qty 2

## 2021-08-06 MED ORDER — BUSPIRONE HCL 5 MG PO TABS
15.0000 mg | ORAL_TABLET | Freq: Two times a day (BID) | ORAL | Status: DC
  Administered 2021-08-06 – 2021-08-15 (×18): 15 mg via ORAL
  Filled 2021-08-06: qty 3
  Filled 2021-08-06: qty 2
  Filled 2021-08-06: qty 3
  Filled 2021-08-06 (×6): qty 2
  Filled 2021-08-06: qty 3
  Filled 2021-08-06 (×6): qty 2
  Filled 2021-08-06 (×3): qty 3

## 2021-08-06 MED ORDER — MORPHINE SULFATE (PF) 4 MG/ML IV SOLN
4.0000 mg | Freq: Once | INTRAVENOUS | Status: AC
  Administered 2021-08-06: 4 mg via INTRAVENOUS
  Filled 2021-08-06: qty 1

## 2021-08-06 MED ORDER — DILTIAZEM HCL ER COATED BEADS 120 MG PO CP24
360.0000 mg | ORAL_CAPSULE | Freq: Every day | ORAL | Status: DC
  Administered 2021-08-07 – 2021-08-13 (×7): 360 mg via ORAL
  Filled 2021-08-06 (×7): qty 3

## 2021-08-06 MED ORDER — SODIUM CHLORIDE 0.9 % IV BOLUS
1000.0000 mL | Freq: Once | INTRAVENOUS | Status: AC
Start: 2021-08-06 — End: 2021-08-07
  Administered 2021-08-06: 1000 mL via INTRAVENOUS

## 2021-08-06 MED ORDER — CARVEDILOL 25 MG PO TABS: 25.0000 mg | ORAL_TABLET | Freq: Two times a day (BID) | ORAL | Status: DC

## 2021-08-06 MED ORDER — ONDANSETRON HCL 4 MG/2ML IJ SOLN
4.0000 mg | Freq: Once | INTRAMUSCULAR | Status: AC
  Administered 2021-08-06: 4 mg via INTRAVENOUS
  Filled 2021-08-06: qty 2

## 2021-08-06 MED ORDER — CARVEDILOL 25 MG PO TABS
25.0000 mg | ORAL_TABLET | Freq: Two times a day (BID) | ORAL | Status: DC
  Administered 2021-08-06 – 2021-08-13 (×13): 25 mg via ORAL
  Filled 2021-08-06 (×13): qty 1

## 2021-08-06 MED ORDER — HYDRALAZINE HCL 20 MG/ML IJ SOLN: 10.0000 mg | INTRAMUSCULAR | Status: DC | PRN

## 2021-08-06 MED ORDER — DIATRIZOATE MEGLUMINE & SODIUM 66-10 % PO SOLN: 90.0000 mL | Freq: Once | ORAL | Status: DC

## 2021-08-06 MED ORDER — LACTATED RINGERS IV SOLN: INTRAVENOUS | Status: DC

## 2021-08-06 MED ORDER — MORPHINE SULFATE (PF) 2 MG/ML IV SOLN
2.0000 mg | INTRAVENOUS | Status: DC | PRN
  Administered 2021-08-06 – 2021-08-08 (×5): 2 mg via INTRAVENOUS
  Filled 2021-08-06 (×5): qty 1

## 2021-08-06 MED ORDER — GABAPENTIN 300 MG PO CAPS: 600.0000 mg | ORAL_CAPSULE | Freq: Every day | ORAL | Status: DC

## 2021-08-06 MED ORDER — ACETAMINOPHEN 650 MG RE SUPP: 650.0000 mg | Freq: Four times a day (QID) | RECTAL | Status: DC | PRN

## 2021-08-06 MED ORDER — VENLAFAXINE HCL ER 75 MG PO CP24
75.0000 mg | ORAL_CAPSULE | Freq: Every day | ORAL | Status: DC
  Administered 2021-08-07 – 2021-08-15 (×9): 75 mg via ORAL
  Filled 2021-08-06 (×9): qty 1

## 2021-08-06 MED ORDER — GABAPENTIN 300 MG PO CAPS
300.0000 mg | ORAL_CAPSULE | Freq: Every day | ORAL | Status: DC
  Filled 2021-08-06 (×2): qty 1

## 2021-08-06 MED ORDER — DILTIAZEM HCL ER BEADS 240 MG PO CP24: 360.0000 mg | ORAL_CAPSULE | Freq: Every day | ORAL | Status: DC

## 2021-08-06 MED ORDER — ACETAMINOPHEN 325 MG PO TABS
650.0000 mg | ORAL_TABLET | Freq: Four times a day (QID) | ORAL | Status: DC | PRN
  Administered 2021-08-12 – 2021-08-13 (×4): 650 mg via ORAL
  Filled 2021-08-06 (×4): qty 2

## 2021-08-06 MED ORDER — ONDANSETRON 4 MG PO TBDP
4.0000 mg | ORAL_TABLET | Freq: Four times a day (QID) | ORAL | Status: DC | PRN
  Filled 2021-08-06: qty 1

## 2021-08-06 MED ORDER — GABAPENTIN 300 MG PO CAPS
600.0000 mg | ORAL_CAPSULE | Freq: Every day | ORAL | Status: DC
  Administered 2021-08-06 – 2021-08-14 (×9): 600 mg via ORAL
  Filled 2021-08-06 (×9): qty 2

## 2021-08-06 MED ORDER — IOHEXOL 300 MG/ML  SOLN
80.0000 mL | Freq: Once | INTRAMUSCULAR | Status: AC | PRN
  Administered 2021-08-06: 80 mL via INTRAVENOUS

## 2021-08-06 MED ORDER — SODIUM CHLORIDE 0.9 % IV BOLUS
1000.0000 mL | Freq: Once | INTRAVENOUS | Status: AC
  Administered 2021-08-06: 1000 mL via INTRAVENOUS

## 2021-08-06 NOTE — Consult Note (Signed)
[facility] ?SURGICAL CONSULTATION NOTE (initial) - [id number] ? ? ?HISTORY OF PRESENT ILLNESS (HPI):  ?76 y.o. female presented to [facility] ED today for evaluation of abdominal pain. Patient reports the acute onset of lower abdominal pain and distension yesterday morning. This was followed by the onset of nausea, emesis, and decreased PO intake. This would be relieved with episode of vomiting, burping, or hiccupping but symptoms would return. No flatus or BM. No fever, chills, CP, SOB, urinary changes. No history of similar in the past. No previous bowel obstructions. Previous surgeries are positive for abdominal hysterectomy and open appendectomy. Work up in the ED revealed mild leukocytosis to 13.5K, Hgb to 15.6 (likely hemoconcentration), AKI with sCr - 1.27, hyperkalemia to 5.2. CT Abdomen/Pelvis was concerning for SBO.  ? ?Surgery is consulted by hospitalist physician Dr. Karmen Bongo, MD in this context for evaluation and management of SBO. ? ?PAST MEDICAL HISTORY (PMH):  ?Past Medical History:  ?Diagnosis Date  ? Anxiety   ? Arthritis   ? CHF (congestive heart failure) ([place])   ? Depression   ? Heart murmur   ? HOCM (hypertrophic obstructive cardiomyopathy) ([place])   ? Hypercholesteremia   ? Hypertension   ? Osteoarthritis   ? Osteoporosis   ? Psychiatric illness   ?  ? ?PAST SURGICAL HISTORY ([place]):  ?Past Surgical History:  ?Procedure Laterality Date  ? ABDOMINAL HYSTERECTOMY    ? alcohol ablation of hocm    ? APPENDECTOMY    ? COLONOSCOPY    ? ESOPHAGOGASTRODUODENOSCOPY (EGD) WITH PROPOFOL N/A 03/30/2015  ? Procedure: ESOPHAGOGASTRODUODENOSCOPY (EGD) WITH PROPOFOL;  Surgeon: Hulen Luster, MD;  Location: [facility] ENDOSCOPY;  Service: Gastroenterology;  Laterality: N/A;  ? INSERT / REPLACE / REMOVE PACEMAKER    ? TUBAL LIGATION    ?  ? ?MEDICATIONS:  ?Prior to Admission medications   ?Medication Sig Start Date End Date Taking? Authorizing Provider  ?acetaminophen (TYLENOL) 500 MG tablet Take 500 mg by  mouth every 6 (six) hours as needed.   Yes [provider]  ?busPIRone (BUSPAR) 15 MG tablet Take 15 mg by mouth 2 (two) times daily.   Yes [provider]  ?calcium-vitamin D (OSCAL WITH D) 500-200 MG-UNIT tablet Take 1 tablet by mouth.   Yes [provider]  ?carvedilol (COREG) 25 MG tablet Take 25 mg by mouth 2 (two) times daily with a meal.   Yes [provider]  ?desvenlafaxine (PRISTIQ) 50 MG 24 hr tablet Take 50 mg by mouth daily.   Yes [provider]  ?diltiazem (TIAZAC) 360 MG 24 hr capsule Take 360 mg by mouth daily.   Yes [provider]  ?ergocalciferol (VITAMIN D2) 50000 units capsule Take 50,000 Units by mouth once a week. On the first of every month   Yes [provider]  ?famotidine (PEPCID) 40 MG tablet Take 40 mg by mouth daily. 08/01/21  Yes [provider]  ?gabapentin (NEURONTIN) 300 MG capsule Take 600 mg by mouth at bedtime. Take 1 capsule (300MG ) by mouth every morning and 2 capsules (600MG ) by mouth every evening   Yes [provider]  ?losartan (COZAAR) 100 MG tablet Take 100 mg by mouth daily.   Yes [provider]  ?Multiple Vitamin (MULTIVITAMIN) tablet Take 1 tablet by mouth daily.   Yes [provider]  ?pravastatin (PRAVACHOL) 80 MG tablet Take 80 mg by mouth daily.    Yes [provider]  ?spironolactone (ALDACTONE) 25 MG tablet Take 25 mg  by mouth daily.   Yes [provider]  ?tiZANidine (ZANAFLEX) 2 MG tablet Take ? to 1 tablet by mouth three times daily as needed   Yes [provider]  ?albuterol (PROVENTIL HFA;VENTOLIN HFA) 108 (90 Base) MCG/ACT inhaler Inhale 2 puffs into the lungs every 6 (six) hours as needed for wheezing or shortness of breath. ?Patient not taking: Reported on 07/28/2021 06/28/17   Milagros Loll, MD  ?Biotin 5 MG TABS Take 1 tablet by mouth daily. ?Patient not taking: Reported on 07/26/2021    [provider]  ?Cholecalciferol  25 MCG (1000 UT) capsule Take 1,000 Units by mouth daily. ?Patient not taking: Reported on 08/02/2021    [provider]  ?ranitidine (ZANTAC) 150 MG capsule Take 150 mg by mouth at bedtime.  ?Patient not taking: Reported on 07/31/2021    [provider]  ?  ? ?ALLERGIES:  ?Allergies  ?Allergen Reactions  ? Oxycodone   ? Percocet [Oxycodone-Acetaminophen]   ? Phenobarbital   ? Vasotec [Enalapril Maleate]   ?  ? ?SOCIAL HISTORY:  ?Social History  ? ?Socioeconomic History  ? Marital status: Married  ?  Spouse name: Not on file  ? Number of children: Not on file  ? Years of education: Not on file  ? Highest education level: Not on file  ?Occupational History  ? Not on file  ?Tobacco Use  ? Smoking status: Never  ? Smokeless tobacco: Never  ?Vaping Use  ? Vaping Use: Never used  ?Substance and Sexual Activity  ? Alcohol use: No  ? Drug use: No  ? Sexual activity: Not on file  ?Other Topics Concern  ? Not on file  ?Social History Narrative  ? Not on file  ? ?Social Determinants of Health  ? ?Financial Resource Strain: Not on file  ?Food Insecurity: Not on file  ?Transportation Needs: Not on file  ?Physical Activity: Not on file  ?Stress: Not on file  ?Social Connections: Not on file  ?Intimate Partner Violence: Not on file  ?  ? ?FAMILY HISTORY:  ?Family History  ?Problem Relation Age of Onset  ? Heart Problems Mother   ? Cancer Father   ? Breast cancer Sister   ?  ? ? ?REVIEW OF SYSTEMS:  ?Review of Systems  ?Constitutional:  Negative for chills and fever.  ?HENT:  Negative for congestion and sore throat.   ?Respiratory:  Negative for cough and shortness of breath.   ?Cardiovascular:  Negative for chest pain and palpitations.  ?Gastrointestinal:  Positive for abdominal pain, nausea and vomiting. Negative for blood in stool, constipation and diarrhea.  ?Genitourinary:  Negative for dysuria and urgency.  ?All other systems reviewed and are negative. ? ?VITAL SIGNS:  ?Temp:  [97.7 ?F (36.5 ?C)] 97.7 ?F  (36.5 ?C) (04/24 1056) ?Pulse Rate:  [72-74] 74 (04/24 1330) ?Resp:  [16-18] 18 (04/24 1330) ?BP: (127-142)/(54-55) 142/54 (04/24 1330) ?SpO2:  [93 %-94 %] 93 % (04/24 1330) ?Weight:  [69.8 kg] 69.8 kg (04/24 1053)     Height: 5\' 2"  (157.5 cm) Weight: 69.8 kg BMI (Calculated): 28.14  ? ?INTAKE/OUTPUT:  ?No intake/output data recorded. ? ?PHYSICAL EXAM:  ?Physical Exam ?Vitals and nursing note reviewed. Exam conducted with a chaperone present.  ?Constitutional:   ?   General: She is not in acute distress. ?   Appearance: She is well-developed. She is obese. She is not ill-appearing.  ?   Comments: Patient resting comfortably at bedside, NAD  ?HENT:  ?  Head: Normocephalic and atraumatic.  ?Eyes:  ?   Extraocular Movements: Extraocular movements intact.  ?   Pupils: Pupils are equal, round, and reactive to light.  ?Cardiovascular:  ?   Rate and Rhythm: Normal rate and regular rhythm.  ?Pulmonary:  ?   Effort: Pulmonary effort is normal. No respiratory distress.  ?   Breath sounds: Normal breath sounds.  ?Abdominal:  ?   General: Abdomen is protuberant. A surgical scar is present. There is distension.  ?   Palpations: Abdomen is soft.  ?   Tenderness: There is no abdominal tenderness. There is no guarding or rebound.  ?   Comments: Abdomen is soft, markedly distended and tympanic, I do not appreciate tenderness but I think this may be masked by IV morphine, no rebound/guarding. Previous surgical scars seen.   ?Genitourinary: ?   Comments: Deferred ?Skin: ?   General: Skin is warm and dry.  ?   Coloration: Skin is not jaundiced.  ?   Findings: No erythema.  ?Neurological:  ?   General: No focal deficit present.  ?   Mental Status: She is alert and oriented to person, place, and time.  ?Psychiatric:     ?   Mood and Affect: Mood normal.     ?   Behavior: Behavior normal.  ? ? ? ?Labs:  ? ?  Latest Ref Rng & Units 07/29/2021  ? 10:57 AM 06/26/2017  ?  5:43 AM 06/25/2017  ? 10:24 AM  ?CBC  ?WBC 4.0 - 10.5 K/uL 13.5   8.1    9.4    ?Hemoglobin 12.0 - 15.0 g/dL 15.6   14.3   13.9    ?Hematocrit 36.0 - 46.0 % 45.8   40.8   40.1    ?Platelets 150 - 400 K/uL 359   308   295    ? ? ?  Latest Ref Rng & Units 07/26/2021  ? 10:57 AM 3/1

## 2021-08-06 NOTE — ED Notes (Signed)
Pt knows need for ua . 

## 2021-08-06 NOTE — H&P (Signed)
?History and Physical  ? ? ?Patient: Rhonda Skinner [id number] DOB: 1945/05/14 ?DOA: 07/15/2021 ?DOS: the patient was seen and examined on 07/24/2021 ?PCP: Gayland Curry, MD  ?Patient coming from: Home - lives with husband and son; NOK: Husband and daughter, [phone] ? ? ?Chief Complaint: Abdominal pain ? ?HPI: Rhonda Skinner is a 76 y.o. female with medical history significant of chronic systolic CHF; pacemaker; HTN; DM; and HLD presenting with abdominal pain.  She started with sharp abdominal pains yesterday AM about 930.  She was sitting in the kitchen and drinking coffee, didn't feel like eating (but she often doesn't eat first thing).  The pain was in the RLQ and wouldn't go away.  She went to lie down and it kept hurting.  As time went on it spread throughout the abdomen and into her stomach area.  She felt like her muscles were sore but it just wouldn't go away.  She never ate.  She started belching a lot but not passing gas.  She had several BMs.  By late afternoon, she started with n/v and it lasted through this AM.  She would vomit whenever she drank water.  Her pain is currently gone with morphine.  Her last emesis was this AM. ? ? ? ?ER Course:  SBO.  H/o TAH and appy.  Pain started yesterday, n/v.  AKI, creatinine 0.5 -> 1.27.  CT with SBO.  Surgery not called since non-surgical.  Not actively vomiting so no NG tube. ? ? ? ? ?Review of Systems: As mentioned in the history of present illness. All other systems reviewed and are negative. ?Past Medical History:  ?Diagnosis Date  ? Anxiety   ? Arthritis   ? CHF (congestive heart failure) ([place])   ? Depression   ? Heart murmur   ? HOCM (hypertrophic obstructive cardiomyopathy) ([place])   ? Hypercholesteremia   ? Hypertension   ? Osteoarthritis   ? Osteoporosis   ? Psychiatric illness   ? ?Past Surgical History:  ?Procedure Laterality Date  ? ABDOMINAL HYSTERECTOMY    ? alcohol ablation of hocm    ? APPENDECTOMY    ? COLONOSCOPY    ?  ESOPHAGOGASTRODUODENOSCOPY (EGD) WITH PROPOFOL N/A 03/30/2015  ? Procedure: ESOPHAGOGASTRODUODENOSCOPY (EGD) WITH PROPOFOL;  Surgeon: Hulen Luster, MD;  Location: [facility] ENDOSCOPY;  Service: Gastroenterology;  Laterality: N/A;  ? INSERT / REPLACE / REMOVE PACEMAKER    ? TUBAL LIGATION    ? ?Social History:  reports that she has never smoked. She has never used smokeless tobacco. She reports that she does not drink alcohol and does not use drugs. ? ?Allergies  ?Allergen Reactions  ? Oxycodone   ? Percocet [Oxycodone-Acetaminophen]   ? Phenobarbital   ? Vasotec [Enalapril Maleate]   ? ? ?Family History  ?Problem Relation Age of Onset  ? Heart Problems Mother   ? Cancer Father   ? Breast cancer Sister   ? ? ?Prior to Admission medications   ?Medication Sig Start Date End Date Taking? Authorizing Provider  ?acetaminophen (TYLENOL) 500 MG tablet Take 500 mg by mouth every 6 (six) hours as needed.   Yes [provider]  ?busPIRone (BUSPAR) 15 MG tablet Take 15 mg by mouth 2 (two) times daily.   Yes [provider]  ?calcium-vitamin D (OSCAL WITH D) 500-200 MG-UNIT tablet Take 1 tablet by mouth.   Yes [provider]  ?carvedilol (COREG) 25 MG tablet Take 25 mg by mouth 2 (two) times daily with a  meal.   Yes [provider]  ?desvenlafaxine (PRISTIQ) 50 MG 24 hr tablet Take 50 mg by mouth daily.   Yes [provider]  ?diltiazem (TIAZAC) 360 MG 24 hr capsule Take 360 mg by mouth daily.   Yes [provider]  ?ergocalciferol (VITAMIN D2) 50000 units capsule Take 50,000 Units by mouth once a week. On the first of every month   Yes [provider]  ?famotidine (PEPCID) 40 MG tablet Take 40 mg by mouth daily. 08/01/21  Yes [provider]  ?gabapentin (NEURONTIN) 300 MG capsule Take 600 mg by mouth at bedtime. Take 1 capsule (300MG ) by mouth every morning and 2 capsules (600MG ) by mouth every evening   Yes [provider]  ?losartan (COZAAR) 100 MG  tablet Take 100 mg by mouth daily.   Yes [provider]  ?Multiple Vitamin (MULTIVITAMIN) tablet Take 1 tablet by mouth daily.   Yes [provider]  ?pravastatin (PRAVACHOL) 80 MG tablet Take 80 mg by mouth daily.    Yes [provider]  ?spironolactone (ALDACTONE) 25 MG tablet Take 25 mg by mouth daily.   Yes [provider]  ?tiZANidine (ZANAFLEX) 2 MG tablet Take ? to 1 tablet by mouth three times daily as needed   Yes [provider]  ?albuterol (PROVENTIL HFA;VENTOLIN HFA) 108 (90 Base) MCG/ACT inhaler Inhale 2 puffs into the lungs every 6 (six) hours as needed for wheezing or shortness of breath. ?Patient not taking: Reported on 08/10/2021 06/28/17   Hillary Bow, MD  ?Biotin 5 MG TABS Take 1 tablet by mouth daily. ?Patient not taking: Reported on 08/01/2021    [provider]  ?Cholecalciferol 25 MCG (1000 UT) capsule Take 1,000 Units by mouth daily. ?Patient not taking: Reported on 08/12/2021    [provider]  ?ranitidine (ZANTAC) 150 MG capsule Take 150 mg by mouth at bedtime.  ?Patient not taking: Reported on 08/05/2021    [provider]  ? ? ?Physical Exam: ?Vitals:  ? 07/17/2021 1053 07/14/2021 1056 07/31/2021 1300 08/12/2021 1330  ?BP:  (!) 127/55  (!) 142/54  ?Pulse:  72 73 74  ?Resp:  16 16 18   ?Temp:  97.7 ?F (36.5 ?C)    ?TempSrc:  Oral    ?SpO2:  93% 94% 93%  ?Weight: 69.8 kg     ?Height: 5\' 2"  (1.575 m)     ? ?General:  Appears calm and comfortable and is in NAD, no NG tube, very conversant ?Eyes:   EOMI, normal lids, iris ?ENT:  grossly normal hearing, lips & tongue, mmm ?Neck:  no LAD, masses or thyromegaly ?Cardiovascular:  RRR, no m/r/g. No LE edema.  ?Respiratory:   CTA bilaterally with no wheezes/rales/rhonchi.  Normal respiratory effort. ?Abdomen:  soft, mildly diffusely tender, mildly distended, +BS ?Skin:  no rash or induration seen on limited exam ?Musculoskeletal:  grossly normal tone BUE/BLE, good ROM, no bony  abnormality ?Psychiatric:  grossly normal mood and affect, speech fluent and appropriate, AOx3 ?Neurologic:  CN 2-12 grossly intact, moves all extremities in coordinated fashion ? ? ?Radiological Exams on Admission: ?Independently reviewed - see discussion in A/P where applicable ? ?CT ABDOMEN PELVIS W CONTRAST ? ?Result Date: 07/21/2021 ?CLINICAL DATA:  Abdominal pain for 1 day with vomiting. Dark colored emesis today. EXAM: CT ABDOMEN AND PELVIS WITH CONTRAST TECHNIQUE: Multidetector CT imaging of the abdomen and pelvis was performed using the standard protocol following bolus administration of intravenous contrast. RADIATION DOSE REDUCTION: This exam was  performed according to the departmental dose-optimization program which includes automated exposure control, adjustment of the mA and/or kV according to patient size and/or use of iterative reconstruction technique. CONTRAST:  12mL OMNIPAQUE IOHEXOL 300 MG/ML  SOLN COMPARISON:  12/03/2012 FINDINGS: Lower chest: Calcified granuloma is identified within the left lower lobe, unchanged. Hepatobiliary: No focal liver abnormality is seen. No gallstones, gallbladder wall thickening, or biliary dilatation. Pancreas: Unremarkable. No pancreatic ductal dilatation or surrounding inflammatory changes. Spleen: Normal in size without focal abnormality. Adrenals/Urinary Tract: Adrenal glands are unremarkable. Kidneys are normal, without renal calculi, focal lesion, or hydronephrosis. Bladder is unremarkable. Stomach/Bowel: Moderate fluid-filled hiatal hernia. There is fecalized proximal jejunum which likely reflects small bowel stasis. Beyond this point, there is abnormal small bowel dilatation involving the mid to distal small bowel loops. The dilated small bowel loops measure up to 3.1 cm. Transition to decreased caliber terminal ileum is noted within the right hemipelvis, image 66/2. Small bowel air-fluid levels are identified. Partially decompressed colon is noted with mild  wall thickening. Sigmoid diverticulosis without signs of acute diverticulitis. Vascular/Lymphatic: Extensive aortic atherosclerosis. No abdominopelvic adenopathy. Reproductive: Status post hysterectomy. No adnexal

## 2021-08-06 NOTE — ED Provider Notes (Signed)
? ?[facility] ?Provider Note ? ? ? Event Date/Time  ? First MD Initiated Contact with Patient 08/04/2021 1236   ?  (approximate) ? ? ?History  ? ?Abdominal Pain ? ? ?HPI ? ?Rhonda Skinner is a 76 y.o. female with past medical history of hypertrophic cardiomyopathy,, hyperlipidemia, hypertension, osteoarthritis who presents with abdominal pain.  Yesterday patient started having some lower abdominal pain today seemed to radiate up to her abdomen.  Had several episodes of emesis this morning.  Last BM was yesterday.  Patient does have history of hysterectomy.  Denies chest pain dyspnea or urinary symptoms.  Has not had much urination today though because not tolerating p.o. ? ?  ? ?Past Medical History:  ?Diagnosis Date  ? Anxiety   ? Arthritis   ? CHF (congestive heart failure) ([place])   ? Depression   ? Heart murmur   ? HOCM (hypertrophic obstructive cardiomyopathy) ([place])   ? Hypercholesteremia   ? Hypertension   ? Osteoarthritis   ? Osteoporosis   ? Psychiatric illness   ? ? ?Patient Active Problem List  ? Diagnosis Date Noted  ? Bronchitis 06/25/2017  ? ? ? ?Physical Exam  ?Triage Vital Signs: ?ED Triage Vitals  ?Enc Vitals Group  ?   BP 07/18/2021 1056 (!) 127/55  ?   Pulse Rate 07/16/2021 1056 72  ?   Resp 07/29/2021 1056 16  ?   Temp 07/29/2021 1056 97.7 ?F (36.5 ?C)  ?   Temp Source 07/22/2021 1056 Oral  ?   SpO2 07/24/2021 1056 93 %  ?   Weight 07/24/2021 1053 153 lb 14.1 oz (69.8 kg)  ?   Height 08/09/2021 1053 5\' 2"  (1.575 m)  ?   Head Circumference --   ?   Peak Flow --   ?   Pain Score 07/16/2021 1052 9  ?   Pain Loc --   ?   Pain Edu? --   ?   Excl. in [place]? --   ? ? ?Most recent vital signs: ?Vitals:  ? 07/24/2021 1300 08/05/2021 1330  ?BP:  (!) 142/54  ?Pulse: 73 74  ?Resp: 16 18  ?Temp:    ?SpO2: 94% 93%  ? ? ? ?General: Awake, no distress.  ?CV:  Good peripheral perfusion.  ?Resp:  Normal effort.  ?Abd:   Abdomen is diffusely tender throughout no guarding mildly distended ?Neuro:              Awake, Alert, Oriented x 3  ?Other:  Mucous membranes are dry ? ? ?ED Results / Procedures / Treatments  ?Labs ?(all labs ordered are listed, but only abnormal results are displayed) ?Labs Reviewed  ?COMPREHENSIVE METABOLIC PANEL - Abnormal; Notable for the following components:  ?    Result Value  ? Sodium 132 (*)   ? Potassium 5.2 (*)   ? Chloride 97 (*)   ? CO2 21 (*)   ? Glucose, Bld 142 (*)   ? BUN 28 (*)   ? Creatinine, Ser 1.27 (*)   ? GFR, Estimated 44 (*)   ? All other components within normal limits  ?CBC - Abnormal; Notable for the following components:  ? WBC 13.5 (*)   ? Hemoglobin 15.6 (*)   ? All other components within normal limits  ?LIPASE, BLOOD  ?URINALYSIS, ROUTINE W REFLEX MICROSCOPIC  ? ? ? ?EKG ? 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 ? ?  RADIOLOGY ?CT reviewed by myself shows dilated small bowel loops consistent with SBO ? ? ?PROCEDURES: ? ?Critical Care performed: No ? ?Procedures ? ?The patient is on the cardiac monitor to evaluate for evidence of arrhythmia and/or significant heart rate changes. ? ? ?MEDICATIONS ORDERED IN ED: ?Medications  ?sodium chloride 0.9 % bolus 1,000 mL (has no administration in time range)  ?sodium chloride 0.9 % bolus 1,000 mL (1,000 mLs Intravenous New Bag/Given 08/05/2021 1337)  ?morphine (PF) 4 MG/ML injection 4 mg (4 mg Intravenous Given 07/20/2021 1336)  ?ondansetron [facility]) injection 4 mg (4 mg Intravenous Given 08/05/2021 1337)  ?iohexol (OMNIPAQUE) 300 MG/ML solution 80 mL (80 mLs Intravenous Contrast Given 07/20/2021 1349)  ? ? ? ?IMPRESSION / MDM / ASSESSMENT AND PLAN / ED COURSE  ?I reviewed the triage vital signs and the nursing notes. ?             ?               ? ?Differential diagnosis includes, but is not limited to, SBO, large bowel obstruction, volvulus, gastritis, colitis, pancreatitis ? ?Patient is a 76 year old female presenting with abdominal pain nausea vomiting.   Is generalized.  She is tender diffusely but abdomen overall benign vitals within normal limits and she is well-appearing does appear somewhat dry however.  She has a leukocytosis 13.5 also with AKI creatinine 1.27 from 0.5 baseline.  She is mildly hyperkalemic potassium 5.2.  Does have history of hypertrophic cardiomyopathy, last EF was normal.  I am concerned by her abdominal exam will obtain a CT abdomen pelvis. ? ?CT abdomen pelvis is consistent with SBO not high-grade and not completely obstructed.  Will admit to medicine service keep n.p.o. and give fluids. ? ?  ? ? ?FINAL CLINICAL IMPRESSION(S) / ED DIAGNOSES  ? ?Final diagnoses:  ?SBO (small bowel obstruction) ([place])  ? ? ? ?Rx / DC Orders  ? ?ED Discharge Orders   ? ? None  ? ?  ? ? ? ?Note:  This document was prepared using Dragon voice recognition software and may include unintentional dictation errors. ?  ?Rada Hay, MD ?07/15/2021 1446 ? ?

## 2021-08-06 NOTE — ED Triage Notes (Signed)
First Nurse Note:  ?Pt via EMS from home. Pt c/o lower abd pain and vomiting when she tried drinking. Pain started couple of days ago. Pt is A&Ox4 and NAD  ? ?Pt has a hx pacemaker  ?123/68 ?98% on RA  ?133 CBG  ?98 HR  ?

## 2021-08-06 NOTE — ED Triage Notes (Signed)
C/O mid abdominal pain x 1 da y.  Also c/o vomiting.  Had been vomiting water, but this morning c/o vomiting up dark burgundy colored emesis. ?

## 2021-08-06 NOTE — ED Notes (Signed)
Informed RN bed assigned 

## 2021-08-07 ENCOUNTER — Inpatient Hospital Stay: Payer: Medicare PPO

## 2021-08-07 DIAGNOSIS — K56609 Unspecified intestinal obstruction, unspecified as to partial versus complete obstruction: Secondary | ICD-10-CM | POA: Diagnosis not present

## 2021-08-07 LAB — CBC
HCT: 38.2 % (ref 36.0–46.0)
Hemoglobin: 12.7 g/dL (ref 12.0–15.0)
MCH: 32.4 pg (ref 26.0–34.0)
MCHC: 33.2 g/dL (ref 30.0–36.0)
MCV: 97.4 fL (ref 80.0–100.0)
Platelets: 280 10*3/uL (ref 150–400)
RBC: 3.92 MIL/uL (ref 3.87–5.11)
RDW: 12.5 % (ref 11.5–15.5)
WBC: 9.7 10*3/uL (ref 4.0–10.5)
nRBC: 0 % (ref 0.0–0.2)

## 2021-08-07 LAB — BASIC METABOLIC PANEL
Anion gap: 6 (ref 5–15)
BUN: 29 mg/dL — ABNORMAL HIGH (ref 8–23)
CO2: 21 mmol/L — ABNORMAL LOW (ref 22–32)
Calcium: 7.8 mg/dL — ABNORMAL LOW (ref 8.9–10.3)
Chloride: 106 mmol/L (ref 98–111)
Creatinine, Ser: 0.91 mg/dL (ref 0.44–1.00)
GFR, Estimated: >60 mL/min (ref >60)
Glucose, Bld: 112 mg/dL — ABNORMAL HIGH (ref 70–99)
Potassium: 4.4 mmol/L (ref 3.5–5.1)
Sodium: 133 mmol/L — ABNORMAL LOW (ref 135–145)

## 2021-08-07 MED ORDER — MENTHOL 3 MG MT LOZG: 1.0000 | LOZENGE | OROMUCOSAL | Status: DC | PRN

## 2021-08-07 MED ORDER — PHENOL 1.4 % MT LIQD
1.0000 | OROMUCOSAL | Status: DC | PRN
  Administered 2021-08-07: 1 via OROMUCOSAL
  Filled 2021-08-07: qty 177

## 2021-08-07 NOTE — Progress Notes (Signed)
?PROGRESS NOTE ? ? ? ?Rhonda HerterJoanne Neathery Skinner  ZOX:[id number] DOB: 02-04-1946 DOA: 07/26/2021 ?PCP: Leim FabryAldridge, Barbara, MD  ? ? ?Brief Narrative:  ?76 y.o. female with medical history significant of chronic systolic CHF; pacemaker; HTN; DM; and HLD presenting with abdominal pain.  She started with sharp abdominal pains yesterday AM about 930.  She was sitting in the kitchen and drinking coffee, didn't feel like eating (but she often doesn't eat first thing).  The pain was in the RLQ and wouldn't go away.  She went to lie down and it kept hurting.  As time went on it spread throughout the abdomen and into her stomach area.  She felt like her muscles were sore but it just wouldn't go away.  She never ate.  She started belching a lot but not passing gas.  She had several BMs.  By late afternoon, she started with n/v and it lasted through this AM.  She would vomit whenever she drank water.  Her pain is currently gone with morphine. ? ?4/25.  Discussed with general surgery.  Plan to place NGT today as patient has not improved over interval ? ? ?Assessment & Plan: ?  ?Principal Problem: ?  SBO (small bowel obstruction) ([facility]) ?Active Problems: ?  Mood disorder ([facility]) ?  Hypercholesteremia ?  Hypertension ?  Chronic diastolic CHF (congestive heart failure) ([facility]) ?  History of cardiac pacemaker ? ?Small bowel obstruction ?Patient with history of prior abdominal surgeries ?Acute onset nausea vomiting with CT findings of small bowel obstruction ?Conservative management recommended as patient is overall stable ?Has not made improvement since admission ?Plan: ?NGT today ?N.p.o. for bowel rest ?Intravenous hydration ?As needed pain control ?Minimize narcotic use ?General surgery follow-up ? ?Chronic diastolic congestive heart failure ?Not acutely exacerbated ?Appears to be compensated ?Holding diuretics for now while n.p.o. ? ?Status post pacemaker ?No acute issues ? ?Essential hypertension ?Coreg and diltiazem resumed ?Cozaar and  Aldactone hold for now, monitor BP and restart as appropriate ? ?Hyperlipidemia ?Home Pravachol on hold for now ? ?Type 2 diabetes mellitus ?Prior A1c 6.4, well controlled ?Does not appear to be on home medications ? ?Mood disorder ?PTA buspirone and venlafaxine ? ? ?DVT prophylaxis: SCDs ?Code Status: Full ?Family Communication: None today ?Disposition Plan: Status is: Inpatient ?Remains inpatient appropriate because: SBO.  NGT to be placed today ? ? ?Level of care: Med-Surg ? ?Consultants:  ?General surgery ? ?Procedures:  ?NGT placement 4/25 ? ?Antimicrobials: ?None ? ? ?Subjective: ?Seen and examined.  Resting in bed.  No visible distress.  No pain complaints.  Did receive morphine this morning. ? ?Objective: ?Vitals:  ? 08/11/2021 1730 07/24/2021 1954 08/07/21 0323 08/07/21 0738  ?BP: (!) 148/52 (!) 156/48 (!) 154/56 (!) 111/52  ?Pulse: 66 67 76 77  ?Resp: 16 18 18 18   ?Temp: 97.7 ?F (36.5 ?C) 97.9 ?F (36.6 ?C) 99.1 ?F (37.3 ?C) 98.9 ?F (37.2 ?C)  ?TempSrc: Oral Oral    ?SpO2: 95% 94% 93% 93%  ?Weight:      ?Height:      ? ? ?Intake/Output Summary (Last 24 hours) at 08/07/2021 1135 ?Last data filed at 08/07/2021 0534 ?Gross per 24 hour  ?Intake 2023.98 ml  ?Output --  ?Net 2023.98 ml  ? ?Filed Weights  ? 07/26/2021 1053  ?Weight: 69.8 kg  ? ? ?Examination: ? ?General exam: Appears calm and comfortable  ?Respiratory system: Clear to auscultation. Respiratory effort normal. ?Cardiovascular system: S1-S2, RRR, no murmurs, no pedal edema ?Gastrointestinal system: Soft, mildly distended,  mild TTP, positive bowel sounds ?Central nervous system: Alert and oriented. No focal neurological deficits. ?Extremities: Symmetric 5 x 5 power. ?Skin: No rashes, lesions or ulcers ?Psychiatry: Judgement and insight appear normal. Mood & affect appropriate.  ? ? ? ?Data Reviewed: I have personally reviewed following labs and imaging studies ? ?CBC: ?Recent Labs  ?Lab 08/31/21 ?1057 08/07/21 ?0740  ?WBC 13.5* 9.7  ?HGB 15.6* 12.7  ?HCT 45.8  38.2  ?MCV 95.4 97.4  ?PLT 359 280  ? ?Basic Metabolic Panel: ?Recent Labs  ?Lab 31-Aug-2021 ?1057 08/07/21 ?0740  ?NA 132* 133*  ?K 5.2* 4.4  ?CL 97* 106  ?CO2 21* 21*  ?GLUCOSE 142* 112*  ?BUN 28* 29*  ?CREATININE 1.27* 0.91  ?CALCIUM 9.5 7.8*  ? ?GFR: ?Estimated Creatinine Clearance: 48.2 mL/min (by C-G formula based on SCr of 0.91 mg/dL). ?Liver Function Tests: ?Recent Labs  ?Lab 2021-08-31 ?1057  ?AST 31  ?ALT 28  ?ALKPHOS 77  ?BILITOT 1.2  ?PROT 8.0  ?ALBUMIN 4.0  ? ?Recent Labs  ?Lab Aug 31, 2021 ?1057  ?LIPASE 29  ? ?No results for input(s): AMMONIA in the last 168 hours. ?Coagulation Profile: ?No results for input(s): INR, PROTIME in the last 168 hours. ?Cardiac Enzymes: ?No results for input(s): CKTOTAL, CKMB, CKMBINDEX, TROPONINI in the last 168 hours. ?BNP (last 3 results) ?No results for input(s): PROBNP in the last 8760 hours. ?HbA1C: ?No results for input(s): HGBA1C in the last 72 hours. ?CBG: ?No results for input(s): GLUCAP in the last 168 hours. ?Lipid Profile: ?No results for input(s): CHOL, HDL, LDLCALC, TRIG, CHOLHDL, LDLDIRECT in the last 72 hours. ?Thyroid Function Tests: ?No results for input(s): TSH, T4TOTAL, FREET4, T3FREE, THYROIDAB in the last 72 hours. ?Anemia Panel: ?No results for input(s): VITAMINB12, FOLATE, FERRITIN, TIBC, IRON, RETICCTPCT in the last 72 hours. ?Sepsis Labs: ?No results for input(s): PROCALCITON, LATICACIDVEN in the last 168 hours. ? ?No results found for this or any previous visit (from the past 240 hour(s)).  ? ? ? ? ? ?Radiology Studies: ?CT ABDOMEN PELVIS W CONTRAST ? ?Result Date: 08-31-21 ?CLINICAL DATA:  Abdominal pain for 1 day with vomiting. Dark colored emesis today. EXAM: CT ABDOMEN AND PELVIS WITH CONTRAST TECHNIQUE: Multidetector CT imaging of the abdomen and pelvis was performed using the standard protocol following bolus administration of intravenous contrast. RADIATION DOSE REDUCTION: This exam was performed according to the departmental dose-optimization  program which includes automated exposure control, adjustment of the mA and/or kV according to patient size and/or use of iterative reconstruction technique. CONTRAST:  75mL OMNIPAQUE IOHEXOL 300 MG/ML  SOLN COMPARISON:  12/03/2012 FINDINGS: Lower chest: Calcified granuloma is identified within the left lower lobe, unchanged. Hepatobiliary: No focal liver abnormality is seen. No gallstones, gallbladder wall thickening, or biliary dilatation. Pancreas: Unremarkable. No pancreatic ductal dilatation or surrounding inflammatory changes. Spleen: Normal in size without focal abnormality. Adrenals/Urinary Tract: Adrenal glands are unremarkable. Kidneys are normal, without renal calculi, focal lesion, or hydronephrosis. Bladder is unremarkable. Stomach/Bowel: Moderate fluid-filled hiatal hernia. There is fecalized proximal jejunum which likely reflects small bowel stasis. Beyond this point, there is abnormal small bowel dilatation involving the mid to distal small bowel loops. The dilated small bowel loops measure up to 3.1 cm. Transition to decreased caliber terminal ileum is noted within the right hemipelvis, image 66/2. Small bowel air-fluid levels are identified. Partially decompressed colon is noted with mild wall thickening. Sigmoid diverticulosis without signs of acute diverticulitis. Vascular/Lymphatic: Extensive aortic atherosclerosis. No abdominopelvic adenopathy. Reproductive: Status post hysterectomy. No adnexal  masses. Other: Small volume of free fluid is identified within the upper abdomen which extends along the left pericolic gutter. No discrete fluid collections identified. No signs of pneumoperitoneum. Musculoskeletal: No acute or significant osseous findings. 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 IMPRESSION: 1. Examination is positive for small bowel obstruction. Transition to decreased caliber terminal ileum is noted within the right hemipelvis at the level of the  proximal terminal ileum 2. Small volume of free fluid identified within the upper abdomen which extends along the left pericolic gutter. 3. Sigmoid diverticulosis without signs of acute diverticulitis. 4

## 2021-08-07 NOTE — TOC Initial Note (Signed)
Transition of Care (TOC) - Initial/Assessment Note  ? ? ?Patient Details  ?Name: Rhonda Skinner ?MRN: [id number] ?Date of Birth: 11-30-1945 ? ?Transition of Care (TOC) CM/SW Contact:    ?Beverly Sessions, RN ?Phone Number: ?08/07/2021, 9:02 AM ? ?Clinical Narrative:                 ? ? ?Transition of Care (TOC) Screening Note ? ? ?Patient Details  ?Name: Rhonda Skinner ?Date of Birth: 13-Jul-1945 ? ? ?Transition of Care (TOC) CM/SW Contact:    ?Beverly Sessions, RN ?Phone Number: ?08/07/2021, 9:02 AM ? ? ? ?Transition of Care Department [facility]) has reviewed patient and no TOC needs have been identified at this time. We will continue to monitor patient advancement through interdisciplinary progression rounds. If new patient transition needs arise, please place a TOC consult. ? ? ?  ?  ? ? ?Patient Goals and CMS Choice ?  ?  ?  ? ?Expected Discharge Plan and Services ?  ?  ?  ?  ?  ?                ?  ?  ?  ?  ?  ?  ?  ?  ?  ?  ? ?Prior Living Arrangements/Services ?  ?  ?  ?       ?  ?  ?  ?  ? ?Activities of Daily Living ?Home Assistive Devices/Equipment: None ?ADL Screening (condition at time of admission) ?Patient's cognitive ability adequate to safely complete daily activities?: No ?Is the patient deaf or have difficulty hearing?: Yes ?Does the patient have difficulty seeing, even when wearing glasses/contacts?: No ?Does the patient have difficulty concentrating, remembering, or making decisions?: No ?Patient able to express need for assistance with ADLs?: Yes ?Does the patient have difficulty dressing or bathing?: No ?Independently performs ADLs?: Yes (appropriate for developmental age) ?Does the patient have difficulty walking or climbing stairs?: Yes ?Weakness of Legs: Both ?Weakness of Arms/Hands: Both ? ?Permission Sought/Granted ?  ?  ?   ?   ?   ?   ? ?Emotional Assessment ?  ?  ?  ?  ?  ?  ? ?Admission diagnosis:  SBO (small bowel obstruction) ([place]) [K56.609] ?Patient Active Problem List  ?  Diagnosis Date Noted  ? SBO (small bowel obstruction) ([place]) 07/23/2021  ? Mood disorder ([place]) 07/25/2021  ? Hypercholesteremia 07/27/2021  ? Hypertension 08/03/2021  ? Chronic diastolic CHF (congestive heart failure) ([place]) 08/07/2021  ? History of cardiac pacemaker 07/14/2021  ? Bronchitis 06/25/2017  ? ?PCP:  Gayland Curry, MD ?Pharmacy:   ?[facility] [place], [place] AT [facility] [address] ?[place] ?[place] [address] ?Phone: [phone] Fax: [phone] ? ? ? ? ?Social Determinants of Health (SDOH) Interventions ?  ? ?Readmission Risk Interventions ?   ? View : No data to display.  ?  ?  ?  ? ? ? ?

## 2021-08-07 NOTE — Plan of Care (Signed)
?  Problem: Clinical Measurements: ?Goal: Ability to maintain clinical measurements within normal limits will improve ?Outcome: Progressing ?Goal: Will remain free from infection ?Outcome: Progressing ?Goal: Diagnostic test results will improve ?Outcome: Progressing ?Goal: Respiratory complications will improve ?Outcome: Progressing ?Goal: Cardiovascular complication will be avoided ?Outcome: Progressing ?  ?Problem: Pain Managment: ?Goal: General experience of comfort will improve ?Outcome: Progressing ?  ?Pt is alert and orientedx4. V/S stable. Complained of pain on her abdomen; Morphine given with relief. No nausea and vomiting at this time. No Bm noted and not passing gas.  ?

## 2021-08-07 NOTE — Progress Notes (Addendum)
[facility] ?SURGICAL PROGRESS NOTE (cpt [phone]) ? ?Hospital Day(s): 1.  ? ?Interval History: Patient seen and examined, no acute events or new complaints overnight. Patient reports she continues to have intermittent lower abdominal discomfort only relief from pain medications. No fever, chills, nausea, emesis. Labs are pending this morning. KUB still with dilated loops of small bowel but no gastric distension and there is some air in the colon. No flatus.  ? ?Review of Systems:  ?Constitutional: denies fever, chills  ?HEENT: denies cough or congestion  ?Respiratory: denies any shortness of breath  ?Cardiovascular: denies chest pain or palpitations  ?Gastrointestinal: + abdominal pain, denied N/V ?Genitourinary: denies burning with urination or urinary frequency ?Musculoskeletal: denies pain, decreased motor or sensation ? ?Vital signs in last 24 hours: [min-max] current  ?Temp:  [97.7 ?F (36.5 ?C)-99.1 ?F (37.3 ?C)] 98.9 ?F (37.2 ?C) (04/25 0240) ?Pulse Rate:  [66-77] 77 (04/25 0738) ?Resp:  [13-23] 18 (04/25 9735) ?BP: (111-156)/(47-60) 111/52 (04/25 3299) ?SpO2:  [88 %-95 %] 93 % (04/25 0738) ?Weight:  [69.8 kg] 69.8 kg (04/24 1053)     Height: 5\' 2"  (157.5 cm) Weight: 69.8 kg BMI (Calculated): 28.14  ? ?Intake/Output last 2 shifts:  ?04/24 0701 - 04/25 0700 ?In: 2024 [I.V.:24; IV Piggyback:2000] ?Out: -   ? ?Physical Exam:  ?Constitutional: alert, cooperative and no distress  ?HENT: normocephalic without obvious abnormality  ?Eyes: PERRL, EOM's grossly intact and symmetric  ?Respiratory: breathing non-labored at rest  ?Cardiovascular: regular rate and sinus rhythm  ?Gastrointestinal: Soft, she does not appear overtly tender this morning, she continues to be distended, no rebound/guarding. She is certainly not peritonitic.  ?Musculoskeletal: no edema or wounds, motor and sensation grossly intact, NT  ? ? ?Labs:  ? ?  Latest Ref Rng & Units 08/11/2021  ? 10:57 AM 06/26/2017  ?  5:43 AM 06/25/2017  ?  10:24 AM  ?CBC  ?WBC 4.0 - 10.5 K/uL 13.5   8.1   9.4    ?Hemoglobin 12.0 - 15.0 g/dL 06/27/2017   24.2   68.3    ?Hematocrit 36.0 - 46.0 % 45.8   40.8   40.1    ?Platelets 150 - 400 K/uL 359   308   295    ? ? ?  Latest Ref Rng & Units 08/10/2021  ? 10:57 AM 06/28/2017  ?  5:33 AM 06/27/2017  ?  4:35 AM  ?CMP  ?Glucose 70 - 99 mg/dL 06/29/2017   622   297    ?BUN 8 - 23 mg/dL 28   15   15     ?Creatinine 0.44 - 1.00 mg/dL 989     2.11    ?Sodium 135 - 145 mmol/L 132   133   130    ?Potassium 3.5 - 5.1 mmol/L 5.2   4.2   4.5    ?Chloride 98 - 111 mmol/L 97   104   99    ?CO2 22 - 32 mmol/L 21   23   22     ?Calcium 8.9 - 10.3 mg/dL 9.5   8.1   7.8    ?Total Protein 6.5 - 8.1 g/dL 8.0      ?Total Bilirubin 0.3 - 1.2 mg/dL 1.2      ?Alkaline Phos 38 - 126 U/L 77      ?AST 15 - 41 U/L 31      ?ALT 0 - 44 U/L 28      ? ? ? ?Imaging studies:  ? ?  KUB (08/07/2021) personally reviewed which continues to show dilation of small bowel however there does not appear to be any stomach distension and some gas is appreciable in colon to the level of the transverse colon, and radiologist report reviewed below:  ?IMPRESSION: ?Persistent gaseous dilatation of small bowel loops consistent with ?obstruction. Comparing to scout image from yesterday's CT scan, ?small bowel dilatation appears progressive in the interval. ? ? ?Assessment/Plan: (ICD-10's: K56.609) ?76 y.o. 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 ? ? - I do think we need to proceed with NGT placement today given lack of progress made in 24 hours without this. Order placed; discussed with RN ?- No emergent surgical intervention; She and her family understand that should she fail to resolve or clinically deteriorate, we would proceed sooner to the OR ?           - NPO (okay for sips with medications, ice chips); IVF support ?- Monitor abdominal examination; on-going bowel function ?- Pain control prn; antiemetics prn ?- Morning KUB;  gastrografin challenge tomorrow if no improvement ?           - Mobilization as tolerated ?           - Further management per primary service; we will follow  ? ?All of the above findings and recommendations were discussed with the patient, and the medical team, and all of patient's questions were answered to her expressed satisfaction. ? ?-- ?Lynden Oxford, PA-C ?[facility] ?08/07/2021, 8:23 AM ?M-F: 7am - 4pm ? ?

## 2021-08-08 ENCOUNTER — Inpatient Hospital Stay: Payer: Medicare PPO

## 2021-08-08 DIAGNOSIS — I5032 Chronic diastolic (congestive) heart failure: Secondary | ICD-10-CM

## 2021-08-08 DIAGNOSIS — E871 Hypo-osmolality and hyponatremia: Secondary | ICD-10-CM

## 2021-08-08 DIAGNOSIS — K56609 Unspecified intestinal obstruction, unspecified as to partial versus complete obstruction: Secondary | ICD-10-CM | POA: Diagnosis not present

## 2021-08-08 DIAGNOSIS — I10 Essential (primary) hypertension: Secondary | ICD-10-CM

## 2021-08-08 DIAGNOSIS — E785 Hyperlipidemia, unspecified: Secondary | ICD-10-CM

## 2021-08-08 DIAGNOSIS — E1169 Type 2 diabetes mellitus with other specified complication: Secondary | ICD-10-CM | POA: Diagnosis not present

## 2021-08-08 DIAGNOSIS — F39 Unspecified mood [affective] disorder: Secondary | ICD-10-CM

## 2021-08-08 MED ORDER — DIATRIZOATE MEGLUMINE & SODIUM 66-10 % PO SOLN
90.0000 mL | Freq: Once | ORAL | Status: AC
  Administered 2021-08-08: 90 mL via NASOGASTRIC

## 2021-08-08 MED ORDER — PANTOPRAZOLE SODIUM 40 MG IV SOLR
40.0000 mg | Freq: Two times a day (BID) | INTRAVENOUS | Status: DC
  Administered 2021-08-08 – 2021-08-15 (×16): 40 mg via INTRAVENOUS
  Filled 2021-08-08 (×16): qty 10

## 2021-08-08 MED ORDER — MORPHINE SULFATE (PF) 2 MG/ML IV SOLN
1.0000 mg | INTRAVENOUS | Status: DC | PRN
  Administered 2021-08-08 – 2021-08-15 (×6): 1 mg via INTRAVENOUS
  Filled 2021-08-08 (×6): qty 1

## 2021-08-08 MED ORDER — MORPHINE SULFATE (PF) 2 MG/ML IV SOLN
2.0000 mg | INTRAVENOUS | Status: DC | PRN
  Administered 2021-08-08 – 2021-08-14 (×11): 2 mg via INTRAVENOUS
  Administered 2021-08-15: 1 mg via INTRAVENOUS
  Filled 2021-08-08 (×12): qty 1

## 2021-08-08 MED ORDER — GABAPENTIN 300 MG PO CAPS
300.0000 mg | ORAL_CAPSULE | Freq: Every morning | ORAL | Status: DC
  Administered 2021-08-08 – 2021-08-15 (×8): 300 mg via ORAL
  Filled 2021-08-08 (×8): qty 1

## 2021-08-08 NOTE — Progress Notes (Signed)
[facility] ?SURGICAL PROGRESS NOTE (cpt [phone]) ? ?Hospital Day(s): 2.  ? ?Interval History: Patient seen and examined, no acute events or new complaints overnight. Patient reports she still has intermittent lower and epigastric abdominal pain. She denies fever, chills, nausea, emesis. No new labs. NGT had 200 ccs out in last 24 hours. She does report some flatus. No BM.  ? ?Review of Systems:  ?Constitutional: denies fever, chills  ?HEENT: denies cough or congestion  ?Respiratory: denies any shortness of breath  ?Cardiovascular: denies chest pain or palpitations  ?Gastrointestinal: + abdominal pain, denied N/V ?Genitourinary: denies burning with urination or urinary frequency ?Musculoskeletal: denies pain, decreased motor or sensation ? ?Vital signs in last 24 hours: [min-max] current  ?Temp:  [98.1 ?F (36.7 ?C)-98.7 ?F (37.1 ?C)] 98.7 ?F (37.1 ?C) (04/26 0418) ?Pulse Rate:  [64-71] 68 (04/26 0418) ?Resp:  [18-20] 20 (04/26 0418) ?BP: (116-124)/(50-58) 116/58 (04/26 0418) ?SpO2:  [90 %-92 %] 92 % (04/26 0418)     Height: 5\' 2"  (157.5 cm) Weight: 69.8 kg BMI (Calculated): 28.14  ? ?Intake/Output last 2 shifts:  ?04/25 0701 - 04/26 0700 ?In: 811.9 [I.V.:751.9; NG/GT:60] ?Out: 200 [Emesis/NG output:200]  ? ?Physical Exam:  ?Constitutional: alert, cooperative and no distress  ?HENT: normocephalic without obvious abnormality; NGT in place  ?Eyes: PERRL, EOM's grossly intact and symmetric  ?Respiratory: breathing non-labored at rest  ?Cardiovascular: regular rate and sinus rhythm  ?Gastrointestinal: Soft, she does not appear overtly tender this morning, she continues to be distended, no rebound/guarding. She is certainly not peritonitic.  ?Musculoskeletal: no edema or wounds, motor and sensation grossly intact, NT  ? ? ?Labs:  ? ?  Latest Ref Rng & Units 08/07/2021  ?  7:40 AM 07/14/2021  ? 10:57 AM 06/26/2017  ?  5:43 AM  ?CBC  ?WBC 4.0 - 10.5 K/uL 9.7   13.5   8.1    ?Hemoglobin 12.0 - 15.0 g/dL 12.7    15.6   14.3    ?Hematocrit 36.0 - 46.0 % 38.2   45.8   40.8    ?Platelets 150 - 400 K/uL 280   359   308    ? ? ?  Latest Ref Rng & Units 08/07/2021  ?  7:40 AM 08/09/2021  ? 10:57 AM 06/28/2017  ?  5:33 AM  ?CMP  ?Glucose 70 - 99 mg/dL 112   142   169    ?BUN 8 - 23 mg/dL 29   28   15     ?Creatinine 0.44 - 1.00 mg/dL 0.91   1.27   0.57    ?Sodium 135 - 145 mmol/L 133   132   133    ?Potassium 3.5 - 5.1 mmol/L 4.4   5.2   4.2    ?Chloride 98 - 111 mmol/L 106   97   104    ?CO2 22 - 32 mmol/L 21   21   23     ?Calcium 8.9 - 10.3 mg/dL 7.8   9.5   8.1    ?Total Protein 6.5 - 8.1 g/dL  8.0     ?Total Bilirubin 0.3 - 1.2 mg/dL  1.2     ?Alkaline Phos 38 - 126 U/L  77     ?AST 15 - 41 U/L  31     ?ALT 0 - 44 U/L  28     ? ? ? ?Imaging studies:  ? ?KUB (08/08/2021) personally reviewed which still shows persistent gaseous distension of the small bowel,  and radiologist report reviewed below:  ?IMPRESSION: ?Persistent small-bowel obstruction with similar degree of ?distension. ? ? ?Assessment/Plan: (ICD-10's: K56.609) ?76 y.o. 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 ?  ?          - Continue NGT decompression; LIS; monitor and record ?- No emergent surgical intervention; She and her family understand that should she fail to resolve or clinically deteriorate, we would proceed sooner to the OR ?         - NPO (okay for sips with medications, ice chips); IVF support ?- Monitor abdominal examination; on-going bowel function ?- Pain control prn; antiemetics prn ?- Morning KUB; Will proceed with gastrografin challenge today ?         - Mobilization as tolerated ?         - Further management per primary service; we will follow  ?  ?All of the above findings and recommendations were discussed with the patient, and the medical team, and all of patient's questions were answered to her expressed satisfaction. ?  ?-- ?Edison Simon, PA-C ?[facility] ?08/08/2021, 7:42  AM ?M-F: 7am - 4pm ? ?

## 2021-08-08 NOTE — Evaluation (Signed)
Physical Therapy Evaluation ?Patient Details ?Name: Rhonda Skinner ?MRN: [id number] ?DOB: Jan 05, 1946 ?Today's Date: 08/08/2021 ? ?History of Present Illness ? Pt is a 76 yo female admitted for SBO.  PMH of CHF, DMII, HTN.  ?Clinical Impression ? Patient alert, agreeable to PT with education. Did endorse abdominal pain throughout session, moderate pain signs/symptoms. At baseline the patient lives with family, ambulatory with no AD.  ? ?The patient was able to roll to the L in bed with bed rails. Pt resistant to log roll technique education, preferred handheld assist, modA to come from sidelying to sitting. Sit <> Stand with RW and CGA-supervision, last sit <> stand performed with proper hand technique. She was able to ambulate ~44ft with RW and CGA, but became nauseous, dizzy, pt provided with a chair. After a seated rest break she was able to ambulate an additional 47ft.  Overall the patient demonstrated deficits (see "PT Problem List") that impede the patient's functional abilities, safety, and mobility and would benefit from skilled PT intervention. Recommendation at this time is HHPT with frequent/constant supervision/assistance.    ?   ? ?Recommendations for follow up therapy are one component of a multi-disciplinary discharge planning process, led by the attending physician.  Recommendations may be updated based on patient status, additional functional criteria and insurance authorization. ? ?Follow Up Recommendations Home health PT ? ?  ?Assistance Recommended at Discharge Frequent or constant Supervision/Assistance  ?Patient can return home with the following ? A little help with walking and/or transfers;A little help with bathing/dressing/bathroom;Assistance with cooking/housework;Assist for transportation;Help with stairs or ramp for entrance;Direct supervision/assist for medications management ? ?  ?Equipment Recommendations Rolling walker (2 wheels)  ?Recommendations for Other Services ?    ?   ?Functional Status Assessment Patient has had a recent decline in their functional status and demonstrates the ability to make significant improvements in function in a reasonable and predictable amount of time.  ? ?  ?Precautions / Restrictions Precautions ?Precautions: Fall ?Restrictions ?Weight Bearing Restrictions: No  ? ?  ? ?Mobility ? Bed Mobility ?Overal bed mobility: Needs Assistance ?Bed Mobility: Rolling, Sidelying to Sit ?Rolling: Supervision ?Sidelying to sit: Mod assist ?  ?  ?  ?General bed mobility comments: use of bed rails, pt a bit resistant to log roll education, preferred handheld assist ?  ? ?Transfers ?Overall transfer level: Needs assistance ?Equipment used: Rolling walker (2 wheels) ?Transfers: Sit to/from Stand ?Sit to Stand: Min guard, Supervision ?  ?  ?  ?  ?  ?General transfer comment: preferred to pull on RW, last transfer able to stand safely from recliner with use of BUE on recliner arms ?  ? ?Ambulation/Gait ?  ?Gait Distance (Feet): 25 Feet ?Assistive device: Rolling walker (2 wheels) ?  ?  ?  ?  ?General Gait Details: pt became nauseous, dizzy. seated rest break. then ambulated additional 73ft ? ?Stairs ?  ?  ?  ?  ?  ? ?Wheelchair Mobility ?  ? ?Modified Rankin (Stroke Patients Only) ?  ? ?  ? ?Balance Overall balance assessment: Needs assistance ?Sitting-balance support: Feet supported ?Sitting balance-Leahy Scale: Fair ?  ?  ?Standing balance support: Reliant on assistive device for balance ?Standing balance-Leahy Scale: Fair ?  ?  ?  ?  ?  ?  ?  ?  ?  ?  ?  ?  ?   ? ? ? ?Pertinent Vitals/Pain Pain Assessment ?Pain Assessment: Faces ?Faces Pain Scale: Hurts even more ?Pain Location: abdominal pain  ? ? ?  Home Living Family/patient expects to be discharged to:: Private residence ?Living Arrangements: Spouse/significant other;Children ?Available Help at Discharge: Family;Available 24 hours/day ?Type of Home: House ?Home Access: Stairs to enter ?Entrance Stairs-Rails:  Right;Left ?Entrance Stairs-Number of Steps: 4 ?  ?Home Layout: One level ?Home Equipment: Gilmer Mor - single point;Wheelchair - manual ?   ?  ?Prior Function Prior Level of Function : Independent/Modified Independent ?  ?  ?  ?  ?  ?  ?  ?  ?  ? ? ?Hand Dominance  ?   ? ?  ?Extremity/Trunk Assessment  ? Upper Extremity Assessment ?Upper Extremity Assessment: Generalized weakness ?  ? ?Lower Extremity Assessment ?Lower Extremity Assessment: Generalized weakness ?  ? ?   ?Communication  ? Communication: No difficulties  ?Cognition Arousal/Alertness: Awake/alert ?Behavior During Therapy: [facility] for tasks assessed/performed ?Overall Cognitive Status: Within Functional Limits for tasks assessed ?  ?  ?  ?  ?  ?  ?  ?  ?  ?  ?  ?  ?  ?  ?  ?  ?  ?  ?  ? ?  ?General Comments   ? ?  ?Exercises    ? ?Assessment/Plan  ?  ?PT Assessment Patient needs continued PT services  ?PT Problem List Decreased strength;Decreased mobility;Decreased range of motion;Decreased activity tolerance;Decreased balance;Pain;Decreased knowledge of use of DME ? ?   ?  ?PT Treatment Interventions DME instruction;Therapeutic exercise;Gait training;Balance training;Stair training;Neuromuscular re-education;Functional mobility training;Therapeutic activities;Patient/family education   ? ?PT Goals (Current goals can be found in the Care Plan section)  ?Acute Rehab PT Goals ?Patient Stated Goal: to have less abdominal pain ?PT Goal Formulation: With patient ?Time For Goal Achievement: 08/22/21 ?Potential to Achieve Goals: Good ? ?  ?Frequency Min 2X/week ?  ? ? ?Co-evaluation   ?  ?  ?  ?  ? ? ?  ?AM-PAC PT "6 Clicks" Mobility  ?Outcome Measure Help needed turning from your back to your side while in a flat bed without using bedrails?: A Little ?Help needed moving from lying on your back to sitting on the side of a flat bed without using bedrails?: A Little ?Help needed moving to and from a bed to a chair (including a wheelchair)?: A Little ?Help needed standing  up from a chair using your arms (e.g., wheelchair or bedside chair)?: A Little ?Help needed to walk in hospital room?: A Little ?Help needed climbing 3-5 steps with a railing? : A Lot ?6 Click Score: 17 ? ?  ?End of Session   ?Activity Tolerance: Patient limited by fatigue ?Patient left: in bed;with call bell/phone within reach;with bed alarm set ?Nurse Communication: Mobility status ?PT Visit Diagnosis: Other abnormalities of gait and mobility (R26.89);Difficulty in walking, not elsewhere classified (R26.2);Pain;Muscle weakness (generalized) (M62.81) ?Pain - Right/Left:  (midline) ?Pain - part of body:  (abdominal pain) ?  ? ?Time: 1355-1416 ?PT Time Calculation (min) (ACUTE ONLY): 21 min ? ? ?Charges:   PT Evaluation ?$PT Eval Low Complexity: 1 Low ?PT Treatments ?$Therapeutic Activity: 8-22 mins ?  ?   ? ? ?Olga Coaster PT, DPT ?2:40 PM,08/08/21 ? ? ?

## 2021-08-08 NOTE — Progress Notes (Signed)
?  Progress Note ? ? ?Patient: Rhonda Skinner [id number] DOB: Feb 04, 1946 DOA: 07/22/2021     2 ?DOS: the patient was seen and examined on 08/08/2021 ?  ? ? ?Assessment and Plan: ?* SBO (small bowel obstruction) (HCC) ?As per general surgery, Gastrografin contrast study today.  Patient is n.p.o. and has an NG tube in.  Conservative management for now. ? ?Chronic diastolic CHF (congestive heart failure) (HCC) ?No signs of heart failure.  Holding diuretics.  Careful with IV fluids. ? ?Hyponatremia ?Sodium only 2 points less than normal range.  Continue to monitor. ? ?Type 2 diabetes mellitus with hyperlipidemia (HCC) ?Holding Pravachol.  On sliding scale insulin.  Last outpatient hemoglobin A1c 6.4. ? ?Hypertension ?Patient on Coreg and diltiazem.  Holding Cozaar and Aldactone. ? ?Mood disorder (HCC) ?Continue BuSpar and Effexor ? ? ? ? ?  ? ?Subjective: Patient has some abdominal pain.  States she is passing gas but no bowel movement yet.  Admitted with small bowel obstruction. ? ?Physical Exam: ?Vitals:  ? 08/07/21 2011 08/07/21 2018 08/08/21 2025 08/08/21 0756  ?BP: (!) 124/53  (!) 116/58 (!) 124/50  ?Pulse: 71  68 72  ?Resp: 18  20 19   ?Temp:  98.2 ?F (36.8 ?C) 98.7 ?F (37.1 ?C) 98 ?F (36.7 ?C)  ?TempSrc:  Oral Oral Oral  ?SpO2: 91%  92% 92%  ?Weight:      ?Height:      ? ?Physical Exam ?HENT:  ?   Head: Normocephalic.  ?   Mouth/Throat:  ?   Pharynx: No oropharyngeal exudate.  ?Eyes:  ?   General: Lids are normal.  ?   Conjunctiva/sclera: Conjunctivae normal.  ?Cardiovascular:  ?   Rate and Rhythm: Normal rate and regular rhythm.  ?   Heart sounds: Normal heart sounds, S1 normal and S2 normal.  ?Pulmonary:  ?   Breath sounds: No decreased breath sounds, wheezing, rhonchi or rales.  ?Abdominal:  ?   General: Bowel sounds are decreased. There is distension.  ?   Palpations: Abdomen is soft.  ?   Tenderness: There is generalized abdominal tenderness.  ?Musculoskeletal:  ?   Right ankle: No swelling.  ?    Left ankle: No swelling.  ?Skin: ?   General: Skin is warm.  ?   Findings: No rash.  ?Neurological:  ?   Mental Status: She is alert and oriented to person, place, and time.  ?  ?Data Reviewed: ?Sodium 133, CO2 21, CBC normal range ? ?Family Communication: Declined ? ?Disposition: ?Status is: Inpatient ?Remains inpatient appropriate because: Still has NG tube in and treating for small bowel obstruction conservatively. ? ?Planned Discharge Destination: Home ? ?Author: ? , MD ?08/08/2021 12:59 PM ? ?For on call review [email or web address].  ?

## 2021-08-08 NOTE — Assessment & Plan Note (Addendum)
Watch closely.  If blood pressure rises may be able to give low-dose torsemide but will hold off for right now.  Echocardiogram pending. ?

## 2021-08-08 NOTE — Assessment & Plan Note (Addendum)
Holding all antihypertensive medications currently 

## 2021-08-08 NOTE — Assessment & Plan Note (Signed)
Holding Pravachol.  On sliding scale insulin.  Last outpatient hemoglobin A1c 6.4. ?

## 2021-08-08 NOTE — Assessment & Plan Note (Signed)
Continue BuSpar and Effexor ?

## 2021-08-08 NOTE — Plan of Care (Signed)
?  Problem: Clinical Measurements: ?Goal: Ability to maintain clinical measurements within normal limits will improve ?Outcome: Progressing ?Goal: Will remain free from infection ?Outcome: Progressing ?Goal: Diagnostic test results will improve ?Outcome: Progressing ?Goal: Respiratory complications will improve ?Outcome: Progressing ?Goal: Cardiovascular complication will be avoided ?Outcome: Progressing ?  ?Problem: Pain Managment: ?Goal: General experience of comfort will improve ?Outcome: Progressing ?  ?Pt is involved in and agrees with the plan of care. V/S stable. Complained abdominal pain; morphine given with relief. Denies n&v. NGT in place; drained of dark greenish-blackish output. Reports passing gas but no BM yet. ?

## 2021-08-08 NOTE — Progress Notes (Signed)
Mobility Specialist - Progress Note ? ? 08/08/21 1200  ?Mobility  ?Activity Transferred from bed to chair;Ambulated with assistance in room  ?Level of Assistance Standby assist, set-up cues, supervision of patient - no hands on  ?Assistive Device Front wheel walker  ?Distance Ambulated (ft) 5 ft  ?Activity Response Tolerated well  ?$Mobility charge 1 Mobility  ? ? ? ?Pt lying in bed upon arrival, utilizing RA. Voiced abdominal pain. Pt declined ambulation at this time, but is agreeable to transfer bed-chair. HHA for bed mobility. Supervision and momentum to stand, hand placement cues. Pt ambulated to chair where she was left with alarm set, needs in reach.  ? ? ?Rhonda Skinner ?Mobility Specialist ?08/08/21, 12:19 PM ? ? ?

## 2021-08-08 NOTE — Progress Notes (Signed)
Mobility Specialist - Progress Note ? ? ? 08/08/21 1500  ?Mobility  ?Activity Stood at bedside  ?Level of Assistance Standby assist, set-up cues, supervision of patient - no hands on  ?Assistive Device Front wheel walker  ?Distance Ambulated (ft) 0 ft  ?Activity Response Tolerated well  ?$Mobility charge 1 Mobility  ? ? ?Pt completes STS x 5 with supervision voicing no complaints and is left in room with needs in reach. ? ?Clarisa Schools ?Mobility Specialist ?08/08/21, 4:02 PM ? ? ? ? ?

## 2021-08-08 NOTE — Assessment & Plan Note (Addendum)
Patient is status post operation 08/12/21 for lysis of adhesions.  Postoperative ileus.  Full liquid diet.  General surgery team following. ?

## 2021-08-08 NOTE — Assessment & Plan Note (Addendum)
Today sodium 130. ?

## 2021-08-09 ENCOUNTER — Inpatient Hospital Stay: Payer: Medicare PPO

## 2021-08-09 DIAGNOSIS — E1169 Type 2 diabetes mellitus with other specified complication: Secondary | ICD-10-CM | POA: Diagnosis not present

## 2021-08-09 DIAGNOSIS — I5032 Chronic diastolic (congestive) heart failure: Secondary | ICD-10-CM | POA: Diagnosis not present

## 2021-08-09 DIAGNOSIS — E871 Hypo-osmolality and hyponatremia: Secondary | ICD-10-CM | POA: Diagnosis not present

## 2021-08-09 DIAGNOSIS — K56609 Unspecified intestinal obstruction, unspecified as to partial versus complete obstruction: Secondary | ICD-10-CM | POA: Diagnosis not present

## 2021-08-09 LAB — BASIC METABOLIC PANEL
Anion gap: 7 (ref 5–15)
BUN: 38 mg/dL — ABNORMAL HIGH (ref 8–23)
CO2: 26 mmol/L (ref 22–32)
Calcium: 7.7 mg/dL — ABNORMAL LOW (ref 8.9–10.3)
Chloride: 102 mmol/L (ref 98–111)
Creatinine, Ser: 0.9 mg/dL (ref 0.44–1.00)
GFR, Estimated: >60 mL/min (ref >60)
Glucose, Bld: 123 mg/dL — ABNORMAL HIGH (ref 70–99)
Potassium: 4.3 mmol/L (ref 3.5–5.1)
Sodium: 135 mmol/L (ref 135–145)

## 2021-08-09 LAB — MAGNESIUM: Magnesium: 2.5 mg/dL — ABNORMAL HIGH (ref 1.7–2.4)

## 2021-08-09 MED ORDER — FUROSEMIDE 10 MG/ML IJ SOLN
40.0000 mg | Freq: Once | INTRAMUSCULAR | Status: AC
Start: 2021-08-09 — End: 2021-08-09
  Administered 2021-08-09: 40 mg via INTRAVENOUS
  Filled 2021-08-09: qty 4

## 2021-08-09 MED ORDER — SIMETHICONE 80 MG PO CHEW
80.0000 mg | CHEWABLE_TABLET | Freq: Four times a day (QID) | ORAL | Status: DC | PRN
  Administered 2021-08-09 – 2021-08-10 (×2): 80 mg via ORAL
  Filled 2021-08-09 (×3): qty 1

## 2021-08-09 MED ORDER — IPRATROPIUM-ALBUTEROL 0.5-2.5 (3) MG/3ML IN SOLN
3.0000 mL | Freq: Four times a day (QID) | RESPIRATORY_TRACT | Status: DC | PRN
  Administered 2021-08-11 – 2021-08-14 (×3): 3 mL via RESPIRATORY_TRACT
  Filled 2021-08-09 (×4): qty 3

## 2021-08-09 MED ORDER — IPRATROPIUM-ALBUTEROL 0.5-2.5 (3) MG/3ML IN SOLN
3.0000 mL | Freq: Three times a day (TID) | RESPIRATORY_TRACT | Status: DC
  Administered 2021-08-09: 3 mL via RESPIRATORY_TRACT
  Filled 2021-08-09 (×2): qty 3

## 2021-08-09 NOTE — Plan of Care (Signed)
?  Problem: Clinical Measurements: ?Goal: Ability to maintain clinical measurements within normal limits will improve ?Outcome: Progressing ?Goal: Will remain free from infection ?Outcome: Progressing ?Goal: Diagnostic test results will improve ?Outcome: Progressing ?Goal: Respiratory complications will improve ?Outcome: Progressing ?Goal: Cardiovascular complication will be avoided ?Outcome: Progressing ?  ?Problem: Pain Managment: ?Goal: General experience of comfort will improve ?Outcome: Progressing ?  ?Pt is alert and orientedx4. V/S stable. Morphine given for abdominal pain. Denies nausea. BM noted 1x.  NGT in place - output. ?

## 2021-08-09 NOTE — Care Management Important Message (Signed)
Important Message ? ?Patient Details  ?Name: Rhonda Skinner ?MRN: [id number] ?Date of Birth: [birth year] ? ? ?Medicare Important Message Given:  N/A - LOS <3 / Initial given by admissions ? ? ? ? ?[name] ?08/09/2021, 12:26 PM ?

## 2021-08-09 NOTE — TOC Initial Note (Signed)
Transition of Care (TOC) - Initial/Assessment Note  ? ? ?Patient Details  ?Name: Rhonda Skinner ?MRN: [id number] ?Date of Birth: [birth year] ? ?Transition of Care (TOC) CM/SW Contact:    ?Chapman Fitch, RN ?Phone Number: ?08/09/2021, 1:51 PM ? ?Clinical Narrative:                 ? ?Admitted for: SBO ?Admitted from:Home with husband ?PCP: Dayna Barker  ?Current home health/prior home health/DME: [facility], [facility] ? ?Patient states that her husband and son will pick her up at discharge ? ?Therapy recommending home health.  Patient agreeable.  States she does not have a preference of home heath agency.  Referral made and accepted by [facility] with Frances Furbish. RW referral made to [facility] with [facility]. To be delivered to room prior to discharge  ? ?Expected Discharge Plan: Home w Home Health Services ?Barriers to Discharge: Continued Medical Work up ? ? ?Patient Goals and CMS Choice ?  ?CMS Medicare.gov Compare Post Acute Care list provided to:: Patient ?Choice offered to / list presented to : Patient ? ?Expected Discharge Plan and Services ?Expected Discharge Plan: Home w Home Health Services ?  ?Discharge Planning Services: CM Consult ?Post Acute Care Choice: Home Health ?Living arrangements for the past 2 months: Single Family Home ?                ?DME Arranged: Walker rolling ?DME Agency: [facility] ?Date DME Agency Contacted: 08/09/21 ?  ?Representative spoke with at DME Agency: Zack ?HH Arranged: PT, OT, RN ?HH Agency: [facility] ?Date HH Agency Contacted: 08/09/21 ?  ?Representative spoke with at [facility] Agency: Kandee Keen ? ?Prior Living Arrangements/Services ?Living arrangements for the past 2 months: Single Family Home ?Lives with:: Spouse ?Patient language and need for interpreter reviewed:: Yes ?Do you feel safe going back to the place where you live?: Yes      ?  ?  ?Current home services: DME ?  ? ?Activities of Daily Living ?Home Assistive Devices/Equipment: None ?ADL Screening (condition at time of  admission) ?Patient's cognitive ability adequate to safely complete daily activities?: No ?Is the patient deaf or have difficulty hearing?: Yes ?Does the patient have difficulty seeing, even when wearing glasses/contacts?: No ?Does the patient have difficulty concentrating, remembering, or making decisions?: No ?Patient able to express need for assistance with ADLs?: Yes ?Does the patient have difficulty dressing or bathing?: No ?Independently performs ADLs?: Yes (appropriate for developmental age) ?Does the patient have difficulty walking or climbing stairs?: Yes ?Weakness of Legs: Both ?Weakness of Arms/Hands: Both ? ?Permission Sought/Granted ?  ?  ?   ?   ?   ?   ? ?Emotional Assessment ?  ?  ?  ?Orientation: : Oriented to Self, Oriented to Place, Oriented to  Time, Oriented to Situation ?Alcohol / Substance Use: Not Applicable ?Psych Involvement: No (comment) ? ?Admission diagnosis:  SBO (small bowel obstruction) (HCC) [K56.609] ?Patient Active Problem List  ? Diagnosis Date Noted  ? Type 2 diabetes mellitus with hyperlipidemia (HCC) 08/08/2021  ? Hyponatremia 08/08/2021  ? SBO (small bowel obstruction) (HCC) 08/12/2021  ? Mood disorder (HCC) 07/31/2021  ? Hypercholesteremia 07/25/2021  ? Hypertension 07/27/2021  ? Chronic diastolic CHF (congestive heart failure) (HCC) 07/31/2021  ? History of cardiac pacemaker 07/23/2021  ? Bronchitis 06/25/2017  ? ?PCP:  Leim Fabry, MD ?Pharmacy:   ?[id number] - [place], [place] - [address] AT [facility] OF [address] ?[address] ?[place] [address] ?Phone: [phone] Fax: [phone] ? ? ? ? ?Social Determinants of Health (SDOH) Interventions ?  ? ?Readmission Risk Interventions ?   ? View : No data to display.  ?  ?  ?  ? ? ? ?

## 2021-08-09 NOTE — Progress Notes (Addendum)
Mobility Specialist - Progress Note ? ? 08/09/21 1650  ?Mobility  ?Activity Stood at bedside;Dangled on edge of bed  ?Level of Assistance Standby assist, set-up cues, supervision of patient - no hands on  ?Assistive Device Front wheel walker  ?Distance Ambulated (ft) 0 ft  ?Activity Response Tolerated well  ?$Mobility charge 1 Mobility  ? ? ?During mobility: 61 HR, 83% SpO2 ?Post mobility: 60 HR, 92% SpO2 ? ? ?Pt lying in bed upon arrival, utilizing RA. Pt voiced abdominal tightness and bloating. Pt able to achieve EOB with minA. Noted wheezing while completing bed mobility, sats at 83%---RN entered and applied T[place] on 4L to improve sats to 92%. PLB. Pt stood to RW, per pt request, and performed stationary marching for ~15 seconds before needing to return to seated position. Further activity deferred. Denied dizziness/lightheadedness. Pt able to scoot L towards HOB before returning supine. Pt left in bed with alarm set, needs in reach, family and RN at bedside.  ? ? ?Rhonda Skinner ?Mobility Specialist ?08/09/21, 4:55 PM ? ?

## 2021-08-09 NOTE — Progress Notes (Signed)
[facility] ?SURGICAL PROGRESS NOTE (cpt [phone]) ? ?Hospital Day(s): 3.  ? ?Interval History: Patient seen and examined, no acute events or new complaints overnight. Patient reports she feels much better. No abdominal pain. She denies fever, chills, nausea, emesis. Labs are reassuring. NGT output not recorded. She has had a bowel movement recorded. Gastrografin challenge initiated yesterday and does show contrast progressing throughout entire colon and into rectum. ? ?Review of Systems:  ?Constitutional: denies fever, chills  ?HEENT: denies cough or congestion  ?Respiratory: denies any shortness of breath  ?Cardiovascular: denies chest pain or palpitations  ?Gastrointestinal: denies abdominal pain, denied N/V ?Genitourinary: denies burning with urination or urinary frequency ?Musculoskeletal: denies pain, decreased motor or sensation ? ?Vital signs in last 24 hours: [min-max] current  ?Temp:  [97.7 ?F (36.5 ?C)-98.1 ?F (36.7 ?C)] 97.7 ?F (36.5 ?C) (04/27 0416) ?Pulse Rate:  [62-72] 64 (04/27 0416) ?Resp:  [15-20] 18 (04/27 0416) ?BP: (124-155)/(47-52) 131/47 (04/27 0416) ?SpO2:  [89 %-93 %] 91 % (04/27 0416)     Height: 5\' 2"  (157.5 cm) Weight: 69.8 kg BMI (Calculated): 28.14  ? ?Intake/Output last 2 shifts:  ?04/26 0701 - 04/27 0700 ?In: 1354.3 [I.V.:1244.3; NG/GT:110] ?Out: -   ? ?Physical Exam:  ?Constitutional: alert, cooperative and no distress  ?HENT: normocephalic without obvious abnormality; NGT in place (removed) ?Eyes: PERRL, EOM's grossly intact and symmetric  ?Respiratory: breathing non-labored at rest  ?Cardiovascular: regular rate and sinus rhythm  ?Gastrointestinal: Soft, she does not appear overtly tender this morning, distension improved, no rebound/guarding. She is certainly not peritonitic.  ?Musculoskeletal: no edema or wounds, motor and sensation grossly intact, NT  ? ? ?Labs:  ? ?  Latest Ref Rng & Units 08/07/2021  ?  7:40 AM 08/09/2021  ? 10:57 AM 06/26/2017  ?  5:43 AM  ?CBC   ?WBC 4.0 - 10.5 K/uL 9.7   13.5   8.1    ?Hemoglobin 12.0 - 15.0 g/dL 06/28/2017   93.8   10.1    ?Hematocrit 36.0 - 46.0 % 38.2   45.8   40.8    ?Platelets 150 - 400 K/uL 280   359   308    ? ? ?  Latest Ref Rng & Units 08/09/2021  ?  4:33 AM 08/07/2021  ?  7:40 AM 08/09/2021  ? 10:57 AM  ?CMP  ?Glucose 70 - 99 mg/dL 08/08/2021   025   852    ?BUN 8 - 23 mg/dL 38   29   28    ?Creatinine 0.44 - 1.00 mg/dL 778   2.42   3.53    ?Sodium 135 - 145 mmol/L 135   133   132    ?Potassium 3.5 - 5.1 mmol/L 4.3   4.4   5.2    ?Chloride 98 - 111 mmol/L 102   106   97    ?CO2 22 - 32 mmol/L 26   21   21     ?Calcium 8.9 - 10.3 mg/dL 7.7   7.8   9.5    ?Total Protein 6.5 - 8.1 g/dL   8.0    ?Total Bilirubin 0.3 - 1.2 mg/dL   1.2    ?Alkaline Phos 38 - 126 U/L   77    ?AST 15 - 41 U/L   31    ?ALT 0 - 44 U/L   28    ? ? ? ?Imaging studies:  ? ?KUB + CXR (08/09/2021) personally reviewed which shows air/fluid filled bowel  but there is contrast throughout colon and rectum, and radiologist report pending ? ? ?Assessment/Plan: (ICD-10's: K56.609) ?76 y.o. 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 ?  ?          - NGT with no output overnight; patient with bowel function, and contrast traverses colon. I will go ahead and remove NGT and trial her on CLD.  ?- No emergent surgical intervention; She and her family understand that should she fail to resolve or clinically deteriorate, we would proceed sooner to the OR ?         - CLD ? - IVF Resuscitation until diet advances ?- Monitor abdominal examination; on-going bowel function ?- Pain control prn; antiemetics prn ?         - Mobilization as tolerated ?         - Further management per primary service; we will follow  ?  ?All of the above findings and recommendations were discussed with the patient, and the medical team, and all of patient's questions were answered to her expressed satisfaction. ?  ?-- ?Lynden Oxford, PA-C ?[facility] ?08/09/2021, 7:32 AM ?M-F: 7am - 4pm ? ?

## 2021-08-09 NOTE — Progress Notes (Signed)
?  Progress Note ? ? ?Patient: Rhonda Skinner [id number] DOB: [birth year] DOA: 07/18/2021     3 ?DOS: the patient was seen and examined on 08/09/2021 ?  ? ? ?Assessment and Plan: ?* SBO (small bowel obstruction) ([place]) ?After Gastrografin contrast study yesterday the patient started having bowel movements.  NG tube taken out today and started on clear liquid diet.  Patient still has some bloating and abdominal discomfort.  Today's abdominal x-ray showed persistently dilated loops of small bowel throughout the abdomen.  Findings compatible with partial or intermittent small bowel obstruction. ? ?Chronic diastolic CHF (congestive heart failure) ([place]) ?No signs of heart failure.  Holding diuretics.  Since patient was started on clear liquid diet, I will decrease rate of IV fluids. ? ?Hyponatremia ?Sodium low normal range today at 135. ? ?Type 2 diabetes mellitus with hyperlipidemia ([place]) ?Holding Pravachol.  On sliding scale insulin.  Last outpatient hemoglobin A1c 6.4. ? ?Hypertension ?Patient on Coreg and diltiazem.  Holding Cozaar and Aldactone. ? ?Mood disorder ([place]) ?Continue BuSpar and Effexor ? ? ? ? ?  ? ?Subjective: Patient feeling better today.  Had some bowel movements starting last night.  NG tube is out and started on liquid diet.  She did have a little abdominal bloating and abdominal pain. ? ?Physical Exam: ?Vitals:  ? 08/08/21 1958 08/08/21 2000 08/09/21 0416 08/09/21 0800  ?BP: (!) 144/50  (!) 131/47 (!) 126/50  ?Pulse: 64  64 67  ?Resp: 20  18 16   ?Temp: 98 ?F (36.7 ?C)  97.7 ?F (36.5 ?C) 98.6 ?F (37 ?C)  ?TempSrc: Oral  Oral Oral  ?SpO2: (!) 89% 92% 91% 90%  ?Weight:      ?Height:      ? ?Physical Exam ?HENT:  ?   Head: Normocephalic.  ?   Mouth/Throat:  ?   Pharynx: No oropharyngeal exudate.  ?Eyes:  ?   General: Lids are normal.  ?   Conjunctiva/sclera: Conjunctivae normal.  ?Cardiovascular:  ?   Rate and Rhythm: Normal rate and regular rhythm.  ?   Heart sounds: Normal heart sounds, S1  normal and S2 normal.  ?Pulmonary:  ?   Breath sounds: No decreased breath sounds, wheezing, rhonchi or rales.  ?Abdominal:  ?   General: Bowel sounds are decreased. There is distension.  ?   Palpations: Abdomen is soft.  ?   Tenderness: There is generalized abdominal tenderness.  ?Musculoskeletal:  ?   Right ankle: No swelling.  ?   Left ankle: No swelling.  ?Skin: ?   General: Skin is warm.  ?   Findings: No rash.  ?Neurological:  ?   Mental Status: She is alert and oriented to person, place, and time.  ?  ?Data Reviewed: ?Sodium 135, potassium 4.3 ? ?Family Communication: Declined ? ?Disposition: ?Status is: Inpatient ?Remains inpatient appropriate because: Being treated for small bowel obstruction.  Started on liquid diet today and NG tube out today. ? ?Planned Discharge Destination: Home with Home Health ? ?Author: ?Loletha Grayer, MD ?08/09/2021 2:42 PM ? ?For on call review [email or web address].  ?

## 2021-08-10 ENCOUNTER — Inpatient Hospital Stay: Payer: Medicare PPO

## 2021-08-10 DIAGNOSIS — E1169 Type 2 diabetes mellitus with other specified complication: Secondary | ICD-10-CM | POA: Diagnosis not present

## 2021-08-10 DIAGNOSIS — R531 Weakness: Secondary | ICD-10-CM

## 2021-08-10 DIAGNOSIS — I5033 Acute on chronic diastolic (congestive) heart failure: Secondary | ICD-10-CM

## 2021-08-10 DIAGNOSIS — K56609 Unspecified intestinal obstruction, unspecified as to partial versus complete obstruction: Secondary | ICD-10-CM | POA: Diagnosis not present

## 2021-08-10 DIAGNOSIS — E871 Hypo-osmolality and hyponatremia: Secondary | ICD-10-CM | POA: Diagnosis not present

## 2021-08-10 MED ORDER — POTASSIUM CHLORIDE CRYS ER 20 MEQ PO TBCR
20.0000 meq | EXTENDED_RELEASE_TABLET | Freq: Once | ORAL | Status: AC
  Administered 2021-08-10: 20 meq via ORAL
  Filled 2021-08-10: qty 1

## 2021-08-10 MED ORDER — FUROSEMIDE 10 MG/ML IJ SOLN
20.0000 mg | Freq: Once | INTRAMUSCULAR | Status: AC
  Administered 2021-08-10: 20 mg via INTRAVENOUS
  Filled 2021-08-10: qty 4

## 2021-08-10 MED ORDER — LACTATED RINGERS IV SOLN: INTRAVENOUS | Status: DC

## 2021-08-10 NOTE — Plan of Care (Signed)
?  Problem: Clinical Measurements: ?Goal: Ability to maintain clinical measurements within normal limits will improve ?Outcome: Progressing ?Goal: Will remain free from infection ?Outcome: Progressing ?Goal: Diagnostic test results will improve ?Outcome: Progressing ?Goal: Respiratory complications will improve ?Outcome: Progressing ?Goal: Cardiovascular complication will be avoided ?Outcome: Progressing ?  ?Problem: Pain Managment: ?Goal: General experience of comfort will improve ?Outcome: Progressing ? ?Pt is involved in and agrees with the plan of care. V/S stable. Complained of pain on her abdomen; morphine IV given 1x. No BM this shift. Denies nausea. On oxygen at 4lpm with oxygen saturation at 95%. ?  ?

## 2021-08-10 NOTE — Progress Notes (Signed)
Physical Therapy Treatment ?Patient Details ?Name: Rhonda Skinner ?MRN: [id number] ?DOB: 10/18/1945 ?Today's Date: 08/10/2021 ? ? ?History of Present Illness Pt is a 76 yo female admitted for SBO.  PMH of CHF, DMII, HTN. ? ?  ?PT Comments  ? ? Pt was supine in bed with Hob elevated ~ 10 degrees. She reports not feeling well " gassy" but was willing to participate. She required a lot more assistance this session than previous one. Pt strength is getting weaker. She struggles to maintain even sitting balance. Pt stood one time but c/o severe dizziness. Quickly had to return to supine in bed with symptoms resolving. BP was 133/76 once obtains. Pt quickly drifts off to sleep. Author does not feel pt will be able to safely DC home from acute hospital. She has a procedure planned for tomorrow. DC recs updated to SNF but can be returned to [facility] if she demonstrates improved abilities next session.  ?  ?Recommendations for follow up therapy are one component of a multi-disciplinary discharge planning process, led by the attending physician.  Recommendations may be updated based on patient status, additional functional criteria and insurance authorization. ? ?Follow Up Recommendations ? Skilled nursing-short term rehab (<3 hours/day) ?  ?  ?Assistance Recommended at Discharge Frequent or constant Supervision/Assistance  ?Patient can return home with the following A little help with walking and/or transfers;A little help with bathing/dressing/bathroom;Assistance with cooking/housework;Assist for transportation;Help with stairs or ramp for entrance;Direct supervision/assist for medications management ?  ?Equipment Recommendations ? Rolling walker (2 wheels)  ?  ?   ?Precautions / Restrictions Precautions ?Precautions: Fall ?Restrictions ?Weight Bearing Restrictions: No  ?  ? ?Mobility ? Bed Mobility ?Overal bed mobility: Needs Assistance ?Bed Mobility: Supine to Sit, Sit to Supine ?Rolling: Min assist ?Sidelying to sit:  Min assist, Mod assist ?Supine to sit: Mod assist ?Sit to supine: Mod assist ?  ?General bed mobility comments: Pt was able to progress from supine to short sit with increased time and vcs for technique. pt is much weaker than previously observed. she struggles with even just tolerateing sitting EOB. Posterior LOB in just sitting. ?  ? ?Transfers ?Overall transfer level: Needs assistance ?Equipment used: Rolling walker (2 wheels) ?Transfers: Sit to/from Stand ?Sit to Stand: Min assist, From elevated surface ?  ?   ?General transfer comment: pt stood 1 x however c/o dizziness and require quickly returning to supine in bed. once BP reading obtained, BP 133/ 76 ?  ? ?Ambulation/Gait ?  ?   ?  ?General Gait Details: unable due to response to even just static standing ? ?  ?Balance Overall balance assessment: Needs assistance ?Sitting-balance support: Feet supported ?Sitting balance-Leahy Scale: Poor ?Sitting balance - Comments: pt has posterior LOB throughout sitting EOB. needed min assist throughout sitting to prevent LOB ?  ?Standing balance support: During functional activity, Bilateral upper extremity supported ?Standing balance-Leahy Scale: Poor ?  ?  ?  ?Cognition Arousal/Alertness: Awake/alert ?Behavior During Therapy: [facility] for tasks assessed/performed ?Overall Cognitive Status: Within Functional Limits for tasks assessed ?  ? General Comments: Pt is A and O x 4 ?  ?  ? ?  ?   ?   ? ?Pertinent Vitals/Pain Pain Assessment ?Pain Assessment: 0-10 ?Pain Score: 6  ?Faces Pain Scale: Hurts even more ?Pain Location: abdominal pain ?Pain Descriptors / Indicators: Discomfort ?Pain Intervention(s): Limited activity within patient's tolerance, Monitored during session, Premedicated before session, Repositioned  ? ? ? ?PT Goals (current goals can now be found  in the care plan section) Acute Rehab PT Goals ?Patient Stated Goal: get better so I can get home ?Progress towards PT goals: Not progressing toward goals - comment ? ?   ?Frequency ? ? ? Min 2X/week ? ? ? ?  ?PT Plan Discharge plan needs to be updated  ? ? ?   ?AM-PAC PT "6 Clicks" Mobility   ?Outcome Measure ? Help needed turning from your back to your side while in a flat bed without using bedrails?: A Lot ?Help needed moving from lying on your back to sitting on the side of a flat bed without using bedrails?: A Lot ?Help needed moving to and from a bed to a chair (including a wheelchair)?: A Lot ?Help needed standing up from a chair using your arms (e.g., wheelchair or bedside chair)?: A Lot ?Help needed to walk in hospital room?: A Lot ?Help needed climbing 3-5 steps with a railing? : A Lot ?6 Click Score: 12 ? ?  ?End of Session   ?Activity Tolerance: Other (comment);Patient limited by fatigue ?Patient left: in bed;with call bell/phone within reach;with bed alarm set ?Nurse Communication: Mobility status ?PT Visit Diagnosis: Other abnormalities of gait and mobility (R26.89);Difficulty in walking, not elsewhere classified (R26.2);Pain;Muscle weakness (generalized) (M62.81) ?  ? ? ?Time: [id number] ?PT Time Calculation (min) (ACUTE ONLY): 13 min ? ?Charges:  $Therapeutic Activity: 8-22 mins          ?          ? ?Julaine Fusi PTA ?08/10/21, 1:06 PM  ? ?

## 2021-08-10 NOTE — Assessment & Plan Note (Addendum)
Physical therapy recommends rehab.  TOC looking into options. ?

## 2021-08-10 NOTE — Plan of Care (Signed)

## 2021-08-10 NOTE — Progress Notes (Signed)
?  Progress Note ? ? ?Patient: Rhonda Skinner [id number] DOB: 1945-07-12 DOA: 07/25/2021     4 ?DOS: the patient was seen and examined on 08/10/2021 ?  ? ? ?Assessment and Plan: ?* SBO (small bowel obstruction) (HCC) ?Patient with abdominal pain and distention today.  Case discussed with general surgery and likely will need surgical procedure tomorrow.  Case discussed with general surgery team. ? ?Acute on chronic diastolic CHF (congestive heart failure) (HCC) ?I had a discontinue IV fluids and give a dose of IV Lasix.  Give another dose of IV Lasix this morning.  Chest x-ray does not show pneumonia.  Able to come off oxygen. ? ?Weakness ?Physical therapy changed the recommendations to rehab ? ?Hyponatremia ?Last sodium is 135. ? ?Type 2 diabetes mellitus with hyperlipidemia (HCC) ?Holding Pravachol.  On sliding scale insulin.  Last outpatient hemoglobin A1c 6.4. ? ?Hypertension ?Follow-up patient on Coreg and diltiazem.  Holding Cozaar and Aldactone. ? ?Mood disorder (HCC) ?Continue BuSpar and Effexor ? ? ? ? ?  ? ?Subjective: Patient complains of steadily resolving abdominal pain and bloating.  As per nursing staff still having some diarrhea. ? ?Physical Exam: ?Vitals:  ? 08/09/21 1647 08/09/21 1952 08/10/21 0333 08/10/21 0742  ?BP: (!) 131/56 (!) 122/54 (!) 123/48 (!) 125/52  ?Pulse: 60 (!) 59 63 64  ?Resp: 16 17 18 18   ?Temp: 97.9 ?F (36.6 ?C) (!) 97.5 ?F (36.4 ?C) 98.4 ?F (36.9 ?C) 98.4 ?F (36.9 ?C)  ?TempSrc:  Oral Oral Oral  ?SpO2: 93% 93% 95% 95%  ?Weight:      ?Height:      ? ?Physical Exam ?HENT:  ?   Head: Normocephalic.  ?   Mouth/Throat:  ?   Pharynx: No oropharyngeal exudate.  ?Eyes:  ?   General: Lids are normal.  ?   Conjunctiva/sclera: Conjunctivae normal.  ?Cardiovascular:  ?   Rate and Rhythm: Normal rate and regular rhythm.  ?   Heart sounds: Normal heart sounds, S1 normal and S2 normal.  ?Pulmonary:  ?   Breath sounds: No decreased breath sounds, wheezing, rhonchi or rales.  ?Abdominal:   ?   General: Bowel sounds are decreased. There is distension.  ?   Tenderness: There is generalized abdominal tenderness.  ?Musculoskeletal:  ?   Right ankle: No swelling.  ?   Left ankle: No swelling.  ?Skin: ?   General: Skin is warm.  ?   Findings: No rash.  ?Neurological:  ?   Mental Status: She is alert and oriented to person, place, and time.  ?  ?Data Reviewed: ?Chest x-ray reviewed by me does not show any pneumonia. ? ? ?Disposition: ?Status is: Inpatient ?Remains inpatient appropriate because: Likely will require surgical intervention tomorrow for small bowel obstruction. ? ?Planned Discharge Destination: Rehab ? ?Case discussed with general surgery ? ?Author: ? , MD ?08/10/2021 3:05 PM ? ?For on call review [email or web address].  ?

## 2021-08-10 NOTE — Progress Notes (Signed)
[facility] ?SURGICAL PROGRESS NOTE (cpt [phone]) ? ?Hospital Day(s): 4.  ? ?Interval History: Patient seen and examined, no acute events or new complaints overnight. Patient reports she continues to feel worsening distension this morning. No significant pain. She denied any nausea or emesis but she does belch frequently throughout my interview. No fever, chills. No new labs this morning. She has been on CLD x24 hours; tolerating reasonably. She has passed some flatus and had a BM  ? ?Review of Systems:  ?Constitutional: denies fever, chills  ?HEENT: denies cough or congestion  ?Respiratory: denies any shortness of breath  ?Cardiovascular: denies chest pain or palpitations  ?Gastrointestinal: + distension, denies abdominal pain, denied N/V ?Genitourinary: denies burning with urination or urinary frequency ?Musculoskeletal: denies pain, decreased motor or sensation ? ?Vital signs in last 24 hours: [min-max] current  ?Temp:  [97.5 ?F (36.4 ?C)-98.6 ?F (37 ?C)] 98.4 ?F (36.9 ?C) (04/28 2119) ?Pulse Rate:  [59-67] 64 (04/28 0742) ?Resp:  [16-18] 18 (04/28 0742) ?BP: (122-131)/(48-56) 125/52 (04/28 0742) ?SpO2:  [90 %-95 %] 95 % (04/28 0742)     Height: 5\' 2"  (157.5 cm) Weight: 69.8 kg BMI (Calculated): 28.14  ? ?Intake/Output last 2 shifts:  ?04/27 0701 - 04/28 0700 ?In: 1563.7 [I.V.:1563.7] ?Out: 800 [Emesis/NG output:800]  ? ?Physical Exam:  ?Constitutional: alert, cooperative and no distress  ?HENT: normocephalic without obvious abnormality ?Eyes: PERRL, EOM's grossly intact and symmetric  ?Respiratory: breathing non-labored at rest  ?Cardiovascular: regular rate and sinus rhythm  ?Gastrointestinal: Soft, she continues to be significantly distended, worse than yesterday, no overt tenderness, no rebound/guarding. She is not peritonitic  ?Musculoskeletal: no edema or wounds, motor and sensation grossly intact, NT  ? ? ?Labs:  ? ?  Latest Ref Rng & Units 08/07/2021  ?  7:40 AM 08/08/2021  ? 10:57 AM  06/26/2017  ?  5:43 AM  ?CBC  ?WBC 4.0 - 10.5 K/uL 9.7   13.5   8.1    ?Hemoglobin 12.0 - 15.0 g/dL 06/28/2017   41.7   40.8    ?Hematocrit 36.0 - 46.0 % 38.2   45.8   40.8    ?Platelets 150 - 400 K/uL 280   359   308    ? ? ?  Latest Ref Rng & Units 08/09/2021  ?  4:33 AM 08/07/2021  ?  7:40 AM 07/31/2021  ? 10:57 AM  ?CMP  ?Glucose 70 - 99 mg/dL 08/08/2021   818   563    ?BUN 8 - 23 mg/dL 38   29   28    ?Creatinine 0.44 - 1.00 mg/dL 149   7.02   6.37    ?Sodium 135 - 145 mmol/L 135   133   132    ?Potassium 3.5 - 5.1 mmol/L 4.3   4.4   5.2    ?Chloride 98 - 111 mmol/L 102   106   97    ?CO2 22 - 32 mmol/L 26   21   21     ?Calcium 8.9 - 10.3 mg/dL 7.7   7.8   9.5    ?Total Protein 6.5 - 8.1 g/dL   8.0    ?Total Bilirubin 0.3 - 1.2 mg/dL   1.2    ?Alkaline Phos 38 - 126 U/L   77    ?AST 15 - 41 U/L   31    ?ALT 0 - 44 U/L   28    ? ? ? ?Imaging studies: No new pertinent imaging  studies ? ? ?Assessment/Plan: (ICD-10's: K56.609) ?76 y.o. female unfortunately with some regression this morning with continued worsening distension concerning for persistent small bowel obstruction, possibly partial ?  ?          - I had a long discussion with her this morning regarding her course. Despite her lack of nausea/emesis and contrast traversing her colon during gastrografin challenge, she has otherwise failed to make clinical improvements and remains significantly distended. At this point, I think we have failed to make any improvement and att this point we should pursue lysis of adhesions. I think we can attempt laparoscopic with low threshold to proceed to laparotomy if needed. Patient understands and is in agreement. Will tentatively plan for tomorrow (04/29) with Dr Claudine Mouton. Low threshold to replace NGT today. NPO at midnight. Pending her course, we may need to consider TPN post-operatively (She is a [employer] Witness, unsure if her beliefs allow this).  ? ? - Morning KUB ? - IVF Resuscitation until diet advances ?- Monitor abdominal  examination; on-going bowel function ?- Pain control prn; antiemetics prn ?         - Mobilization as tolerated ?         - Further management per primary service; we will follow  ?  ?All of the above findings and recommendations were discussed with the patient, and the medical team, and all of patient's questions were answered to her expressed satisfaction. ?  ?-- ?Lynden Oxford, PA-C ?[facility] ?08/10/2021, 7:49 AM ?M-F: 7am - 4pm ? ?

## 2021-08-11 ENCOUNTER — Inpatient Hospital Stay: Payer: Medicare PPO

## 2021-08-11 ENCOUNTER — Encounter: Admission: EM | Disposition: E | Payer: Self-pay | Source: Home / Self Care | Attending: Internal Medicine

## 2021-08-11 ENCOUNTER — Inpatient Hospital Stay: Payer: Medicare PPO | Admitting: Anesthesiology

## 2021-08-11 DIAGNOSIS — I5033 Acute on chronic diastolic (congestive) heart failure: Secondary | ICD-10-CM | POA: Diagnosis not present

## 2021-08-11 DIAGNOSIS — E871 Hypo-osmolality and hyponatremia: Secondary | ICD-10-CM | POA: Diagnosis not present

## 2021-08-11 DIAGNOSIS — R531 Weakness: Secondary | ICD-10-CM

## 2021-08-11 DIAGNOSIS — K56609 Unspecified intestinal obstruction, unspecified as to partial versus complete obstruction: Secondary | ICD-10-CM | POA: Diagnosis not present

## 2021-08-11 HISTORY — PX: LAPAROTOMY: SHX154

## 2021-08-11 HISTORY — PX: ROBOTIC ASSISTED LAPAROSCOPIC LYSIS OF ADHESION: SHX6080

## 2021-08-11 LAB — CBC
HCT: 34.7 % — ABNORMAL LOW (ref 36.0–46.0)
Hemoglobin: 11.7 g/dL — ABNORMAL LOW (ref 12.0–15.0)
MCH: 32.1 pg (ref 26.0–34.0)
MCHC: 33.7 g/dL (ref 30.0–36.0)
MCV: 95.1 fL (ref 80.0–100.0)
Platelets: 290 10*3/uL (ref 150–400)
RBC: 3.65 MIL/uL — ABNORMAL LOW (ref 3.87–5.11)
RDW: 11.9 % (ref 11.5–15.5)
WBC: 4.9 10*3/uL (ref 4.0–10.5)
nRBC: 0 % (ref 0.0–0.2)

## 2021-08-11 LAB — BASIC METABOLIC PANEL
Anion gap: 9 (ref 5–15)
BUN: 40 mg/dL — ABNORMAL HIGH (ref 8–23)
CO2: 27 mmol/L (ref 22–32)
Calcium: 7.9 mg/dL — ABNORMAL LOW (ref 8.9–10.3)
Chloride: 95 mmol/L — ABNORMAL LOW (ref 98–111)
Creatinine, Ser: 1.03 mg/dL — ABNORMAL HIGH (ref 0.44–1.00)
GFR, Estimated: 56 mL/min — ABNORMAL LOW (ref >60)
Glucose, Bld: 111 mg/dL — ABNORMAL HIGH (ref 70–99)
Potassium: 4.2 mmol/L (ref 3.5–5.1)
Sodium: 131 mmol/L — ABNORMAL LOW (ref 135–145)

## 2021-08-11 LAB — MAGNESIUM: Magnesium: 2.4 mg/dL (ref 1.7–2.4)

## 2021-08-11 SURGERY — LYSIS, ADHESIONS, ROBOT-ASSISTED, LAPAROSCOPIC
Anesthesia: General | Site: Abdomen

## 2021-08-11 MED ORDER — LACTATED RINGERS IV SOLN: INTRAVENOUS | Status: DC | PRN

## 2021-08-11 MED ORDER — 0.9 % SODIUM CHLORIDE (POUR BTL) OPTIME
TOPICAL | Status: DC | PRN
  Administered 2021-08-11: 500 mL

## 2021-08-11 MED ORDER — SUCCINYLCHOLINE CHLORIDE 200 MG/10ML IV SOSY
PREFILLED_SYRINGE | INTRAVENOUS | Status: DC | PRN
  Administered 2021-08-11: 120 mg via INTRAVENOUS

## 2021-08-11 MED ORDER — PROPOFOL 10 MG/ML IV BOLUS
INTRAVENOUS | Status: DC | PRN
  Administered 2021-08-11: 140 mg via INTRAVENOUS
  Administered 2021-08-11: 60 mg via INTRAVENOUS

## 2021-08-11 MED ORDER — SUCCINYLCHOLINE CHLORIDE 200 MG/10ML IV SOSY
PREFILLED_SYRINGE | INTRAVENOUS | Status: AC
  Filled 2021-08-11: qty 10

## 2021-08-11 MED ORDER — BUPIVACAINE-EPINEPHRINE (PF) 0.25% -1:200000 IJ SOLN
INTRAMUSCULAR | Status: DC | PRN
  Administered 2021-08-11: 16 mL

## 2021-08-11 MED ORDER — DEXAMETHASONE SODIUM PHOSPHATE 10 MG/ML IJ SOLN
INTRAMUSCULAR | Status: DC | PRN
  Administered 2021-08-11: 10 mg via INTRAVENOUS

## 2021-08-11 MED ORDER — ROCURONIUM BROMIDE 10 MG/ML (PF) SYRINGE
PREFILLED_SYRINGE | INTRAVENOUS | Status: AC
  Filled 2021-08-11: qty 10

## 2021-08-11 MED ORDER — CEFAZOLIN SODIUM 1 G IJ SOLR
INTRAMUSCULAR | Status: AC
  Filled 2021-08-11: qty 20

## 2021-08-11 MED ORDER — ONDANSETRON HCL 4 MG/2ML IJ SOLN
INTRAMUSCULAR | Status: DC | PRN
  Administered 2021-08-11: 4 mg via INTRAVENOUS

## 2021-08-11 MED ORDER — ROCURONIUM BROMIDE 100 MG/10ML IV SOLN
INTRAVENOUS | Status: DC | PRN
  Administered 2021-08-11: 30 mg via INTRAVENOUS

## 2021-08-11 MED ORDER — FENTANYL CITRATE (PF) 100 MCG/2ML IJ SOLN
INTRAMUSCULAR | Status: DC | PRN
  Administered 2021-08-11: 25 ug via INTRAVENOUS

## 2021-08-11 MED ORDER — BUPIVACAINE-MELOXICAM ER 200-6 MG/7ML IJ SOLN
INTRAMUSCULAR | Status: AC
  Filled 2021-08-11: qty 1

## 2021-08-11 MED ORDER — PROPOFOL 10 MG/ML IV BOLUS
INTRAVENOUS | Status: AC
  Filled 2021-08-11: qty 20

## 2021-08-11 MED ORDER — LIDOCAINE HCL (CARDIAC) PF 100 MG/5ML IV SOSY
PREFILLED_SYRINGE | INTRAVENOUS | Status: DC | PRN
  Administered 2021-08-11: 100 mg via INTRAVENOUS

## 2021-08-11 MED ORDER — FENTANYL CITRATE (PF) 100 MCG/2ML IJ SOLN: 25.0000 ug | INTRAMUSCULAR | Status: DC | PRN

## 2021-08-11 MED ORDER — ORAL CARE MOUTH RINSE
15.0000 mL | Freq: Two times a day (BID) | OROMUCOSAL | Status: DC
  Administered 2021-08-11 – 2021-08-15 (×9): 15 mL via OROMUCOSAL

## 2021-08-11 MED ORDER — EPHEDRINE SULFATE (PRESSORS) 50 MG/ML IJ SOLN
INTRAMUSCULAR | Status: DC | PRN
  Administered 2021-08-11 (×4): 5 mg via INTRAVENOUS

## 2021-08-11 MED ORDER — SUGAMMADEX SODIUM 200 MG/2ML IV SOLN
INTRAVENOUS | Status: DC | PRN
  Administered 2021-08-11: 200 mg via INTRAVENOUS

## 2021-08-11 MED ORDER — FUROSEMIDE 10 MG/ML IJ SOLN
60.0000 mg | Freq: Once | INTRAMUSCULAR | Status: AC
  Administered 2021-08-11: 60 mg via INTRAVENOUS
  Filled 2021-08-11: qty 8

## 2021-08-11 MED ORDER — BUPIVACAINE-MELOXICAM ER 200-6 MG/7ML IJ SOLN
INTRAMUSCULAR | Status: DC | PRN
  Administered 2021-08-11: 7 mL

## 2021-08-11 MED ORDER — PHENYLEPHRINE HCL-NACL 20-0.9 MG/250ML-% IV SOLN
INTRAVENOUS | Status: AC
Start: 2021-08-11 — End: ?
  Filled 2021-08-11: qty 250

## 2021-08-11 MED ORDER — CEFAZOLIN SODIUM-DEXTROSE 2-3 GM-%(50ML) IV SOLR
INTRAVENOUS | Status: DC | PRN
  Administered 2021-08-11: 2 g via INTRAVENOUS

## 2021-08-11 MED ORDER — PHENYLEPHRINE HCL (PRESSORS) 10 MG/ML IV SOLN
INTRAVENOUS | Status: DC | PRN
  Administered 2021-08-11 (×4): 80 ug via INTRAVENOUS

## 2021-08-11 MED ORDER — LACTATED RINGERS IV SOLN: INTRAVENOUS | Status: DC

## 2021-08-11 MED ORDER — ONDANSETRON HCL 4 MG/2ML IJ SOLN: 4.0000 mg | Freq: Once | INTRAMUSCULAR | Status: DC | PRN

## 2021-08-11 MED ORDER — FENTANYL CITRATE (PF) 100 MCG/2ML IJ SOLN
INTRAMUSCULAR | Status: AC
Start: 2021-08-11 — End: ?
  Filled 2021-08-11: qty 2

## 2021-08-11 SURGICAL SUPPLY — 97 items
APPLIER CLIP 11 MED OPEN (CLIP)
APPLIER CLIP 13 LRG OPEN (CLIP)
BAG LAPAROSCOPIC 12 15 PORT 16 (BASKET) IMPLANT
BAG PRESSURE INF REUSE 3000 (BAG) ×3 IMPLANT
BAG RETRIEVAL 12/15 (BASKET)
BLADE CLIPPER SURG (BLADE) IMPLANT
BLADE SURG SZ10 CARB STEEL (BLADE) ×3 IMPLANT
CANNULA REDUC XI 12-8 STAPL (CANNULA) ×1
CANNULA REDUCER 12-8 DVNC XI (CANNULA) ×2 IMPLANT
CHLORAPREP W/TINT 26 (MISCELLANEOUS) ×3 IMPLANT
CLIP APPLIE 11 MED OPEN (CLIP) IMPLANT
CLIP APPLIE 13 LRG OPEN (CLIP) IMPLANT
COVER BACK TABLE REUSABLE LG (DRAPES) ×3 IMPLANT
COVER TIP SHEARS 8 DVNC (MISCELLANEOUS) ×2 IMPLANT
COVER TIP SHEARS 8MM DA VINCI (MISCELLANEOUS) ×1
DERMABOND ADVANCED (GAUZE/BANDAGES/DRESSINGS) ×1
DERMABOND ADVANCED .7 DNX12 (GAUZE/BANDAGES/DRESSINGS) ×2 IMPLANT
DRAPE ARM DVNC X/XI (DISPOSABLE) ×8 IMPLANT
DRAPE COLUMN DVNC XI (DISPOSABLE) ×2 IMPLANT
DRAPE DA VINCI XI ARM (DISPOSABLE) ×4
DRAPE DA VINCI XI COLUMN (DISPOSABLE) ×1
DRAPE LAPAROTOMY 100X77 ABD (DRAPES) ×3 IMPLANT
ELECT BLADE 6.5 EXT (BLADE) ×3 IMPLANT
ELECT CAUTERY BLADE 6.4 (BLADE) ×3 IMPLANT
ELECT REM PT RETURN 9FT ADLT (ELECTROSURGICAL) ×3
ELECTRODE REM PT RTRN 9FT ADLT (ELECTROSURGICAL) ×2 IMPLANT
GAUZE 4X4 16PLY ~~LOC~~+RFID DBL (SPONGE) ×3 IMPLANT
GELPOINT ADV PLATFORM (ENDOMECHANICALS) ×3
GLOVE ORTHO TXT STRL SZ7.5 (GLOVE) ×12 IMPLANT
GOWN STRL REUS W/ TWL LRG LVL3 (GOWN DISPOSABLE) ×8 IMPLANT
GOWN STRL REUS W/ TWL XL LVL3 (GOWN DISPOSABLE) ×4 IMPLANT
GOWN STRL REUS W/TWL LRG LVL3 (GOWN DISPOSABLE) ×4
GOWN STRL REUS W/TWL XL LVL3 (GOWN DISPOSABLE) ×2
GRASPER SUT TROCAR 14GX15 (MISCELLANEOUS) IMPLANT
HANDLE YANKAUER SUCT BULB TIP (MISCELLANEOUS) ×3 IMPLANT
IRRIGATION STRYKERFLOW (MISCELLANEOUS) IMPLANT
IRRIGATOR STRYKERFLOW (MISCELLANEOUS)
IRRIGATOR SUCT 8 DISP DVNC XI (IRRIGATION / IRRIGATOR) ×2 IMPLANT
IRRIGATOR SUCTION 8MM XI DISP (IRRIGATION / IRRIGATOR) ×1
IV NS IRRIG 3000ML ARTHROMATIC (IV SOLUTION) IMPLANT
KIT IMAGING PINPOINTPAQ (MISCELLANEOUS) ×3 IMPLANT
KIT PINK PAD W/HEAD ARE REST (MISCELLANEOUS) ×3
KIT PINK PAD W/HEAD ARM REST (MISCELLANEOUS) ×2 IMPLANT
KIT TURNOVER KIT A (KITS) ×3 IMPLANT
LIGASURE IMPACT 36 18CM CVD LR (INSTRUMENTS) IMPLANT
MANIFOLD NEPTUNE II (INSTRUMENTS) ×3 IMPLANT
NDL INSUFFLATION 14GA 120MM (NEEDLE) ×2 IMPLANT
NEEDLE HYPO 22GX1.5 SAFETY (NEEDLE) ×3 IMPLANT
NEEDLE INSUFFLATION 14GA 120MM (NEEDLE) ×3 IMPLANT
NS IRRIG 1000ML POUR BTL (IV SOLUTION) ×3 IMPLANT
NS IRRIG 500ML POUR BTL (IV SOLUTION) ×3 IMPLANT
PACK BASIN MAJOR ARMC (MISCELLANEOUS) ×3 IMPLANT
PACK COLON CLEAN CLOSURE (MISCELLANEOUS) ×3 IMPLANT
PACK LAP CHOLECYSTECTOMY (MISCELLANEOUS) ×3 IMPLANT
PLATFORM STD W/COL CELL SVR (ENDOMECHANICALS) ×2 IMPLANT
RELOAD STAPLE 45 3.5 BLU DVNC (STAPLE) IMPLANT
RELOAD STAPLE 60 3.5 BLU DVNC (STAPLE) IMPLANT
RELOAD STAPLER 3.5X45 BLU DVNC (STAPLE) IMPLANT
RELOAD STAPLER 3.5X60 BLU DVNC (STAPLE) IMPLANT
RETRACTOR WOUND ALXS 18CM MED (MISCELLANEOUS) IMPLANT
RTRCTR WOUND ALEXIS O 18CM MED (MISCELLANEOUS)
SCISSORS METZENBAUM CVD 33 (INSTRUMENTS) IMPLANT
SEAL CANN UNIV 5-8 DVNC XI (MISCELLANEOUS) ×6 IMPLANT
SEAL XI 5MM-8MM UNIVERSAL (MISCELLANEOUS) ×3
SEALER VESSEL DA VINCI XI (MISCELLANEOUS)
SEALER VESSEL EXT DVNC XI (MISCELLANEOUS) IMPLANT
SOLUTION ELECTROLUBE (MISCELLANEOUS) ×3 IMPLANT
SPIKE FLUID TRANSFER (MISCELLANEOUS) ×3 IMPLANT
SPONGE T-LAP 18X18 ~~LOC~~+RFID (SPONGE) ×12 IMPLANT
SPONGE T-LAP 4X18 ~~LOC~~+RFID (SPONGE) ×3 IMPLANT
STAPLER 45 DA VINCI SURE FORM (STAPLE)
STAPLER 45 SUREFORM DVNC (STAPLE) IMPLANT
STAPLER 60 DA VINCI SURE FORM (STAPLE)
STAPLER 60 SUREFORM DVNC (STAPLE) IMPLANT
STAPLER CANNULA SEAL DVNC XI (STAPLE) ×2 IMPLANT
STAPLER CANNULA SEAL XI (STAPLE) ×1
STAPLER RELOAD 3.5X45 BLU DVNC (STAPLE)
STAPLER RELOAD 3.5X45 BLUE (STAPLE)
STAPLER RELOAD 3.5X60 BLU DVNC (STAPLE)
STAPLER RELOAD 3.5X60 BLUE (STAPLE)
STAPLER SKIN PROX 35W (STAPLE) ×3 IMPLANT
SUT MNCRL 4-0 (SUTURE) ×2
SUT MNCRL 4-0 27XMFL (SUTURE) ×4
SUT PDS AB 1 CT  36 (SUTURE) ×3
SUT PDS AB 1 CT 36 (SUTURE) ×6 IMPLANT
SUT SILK 3-0 (SUTURE) ×3 IMPLANT
SUT V-LOC 90 ABS 3-0 VLT  V-20 (SUTURE) ×2
SUT V-LOC 90 ABS 3-0 VLT V-20 (SUTURE) ×4 IMPLANT
SUT VIC AB 3-0 SH 27 (SUTURE) ×2
SUT VIC AB 3-0 SH 27X BRD (SUTURE) ×4 IMPLANT
SUT VICRYL 2-0 54IN ABS (SUTURE) IMPLANT
SUTURE MNCRL 4-0 27XMF (SUTURE) ×4 IMPLANT
TRAY FOLEY MTR SLVR 16FR STAT (SET/KITS/TRAYS/PACK) ×3 IMPLANT
TROCAR Z-THREAD FIOS 11X100 BL (TROCAR) ×3 IMPLANT
TROCAR Z-THREAD OPTICAL 5X100M (TROCAR) IMPLANT
TUBING EVAC SMOKE HEATED PNEUM (TUBING) ×3 IMPLANT
WATER STERILE IRR 500ML POUR (IV SOLUTION) ×3 IMPLANT

## 2021-08-11 NOTE — Anesthesia Postprocedure Evaluation (Signed)
Anesthesia Post Note ? ?Patient: Rhonda Skinner ? ?Procedure(s) Performed: XI ROBOTIC ASSISTED LAPAROSCOPIC LYSIS OF ADHESION (Abdomen) ?EXPLORATORY LAPAROTOMY ? ?Patient location during evaluation: PACU ?Anesthesia Type: General ?Level of consciousness: awake and alert ?Pain management: pain level controlled ?Vital Signs Assessment: post-procedure vital signs reviewed and stable ?Respiratory status: spontaneous breathing, nonlabored ventilation, respiratory function stable and patient connected to nasal cannula oxygen ?Cardiovascular status: blood pressure returned to baseline and stable ?Postop Assessment: no apparent nausea or vomiting ?Anesthetic complications: no ? ? ?No notable events documented. ? ? ?Last Vitals:  ?Vitals:  ? 08-20-2021 1600 Aug 20, 2021 1625  ?BP: (!) 109/50 117/64  ?Pulse: 69 70  ?Resp: 18   ?Temp: 36.6 ?C   ?SpO2: 92% 90%  ?  ?Last Pain:  ?Vitals:  ? 08/20/2021 1545  ?TempSrc:   ?PainSc: Asleep  ? ? ?  ?  ?  ?  ?  ?  ? ?Felicita Gage ? ? ? ? ?

## 2021-08-11 NOTE — Op Note (Signed)
Robotic assisted laparoscopic lysis of adhesions ? ?Pre-operative Diagnosis: Small bowel obstruction secondary to adhesions  ? ?Post-operative Diagnosis: Small bowel obstruction due to pelvic compartmentalization secondary to band adhesion. ?  ?Surgeon:  Campbell Lerner, M.D., FACS ?  ?Anesthesia: Gen. with endotracheal tube ?  ?Findings: Band adhesion from sigmoid appendices epiploicae to right pelvic sidewall, partitioning the outlet of the pelvis, constricting loops of small bowel within the pelvic space.  Video obtained. ? ? ?Estimated Blood Loss: 5 cc ?  ?Complications: none      ?     ?Procedure Details  ?The patient was seen again in the Holding Room. The benefits, complications, treatment options, and expected outcomes were discussed with the patient. The risks of bleeding, infection, recurrence of symptoms, failure to resolve symptoms, bowel injury, mesh placement, mesh infection, any of which could require further surgery were reviewed with the patient. The likelihood of improving the patient's symptoms with return to their baseline status is good.  The patient and/or family concurred with the proposed plan, giving informed consent.  The patient was taken to Operating Room, identified and the procedure verified.  A Time Out was held and the above information confirmed. ?  ?Prior to the induction of general anesthesia, antibiotic prophylaxis was administered. VTE prophylaxis was in place. General endotracheal anesthesia was then administered and tolerated well. After the induction, the abdomen was prepped with Chloraprep and draped in the sterile fashion. The patient was positioned in the supine position. ?After local infiltration of quarter percent Marcaine with epinephrine, stab incision was made left upper quadrant.  Just below the costal margin approximately midclavicular line the Veress needle is passed with sensation of the layers to penetrate the abdominal wall and into the peritoneum.  Saline drop  test is confirmed peritoneal placement.  Insufflation is initiated with carbon dioxide to pressures of 15 mmHg. ?Marcaine quarter percent was used to inject all the incision sites. ?We used a left upper quadrant subcostal incision and using an optical FIOS 11 mm trocar, it was inserted with direct visualization and pneumoperitoneum obtained.  No hemodynamic compromise, no evidence of intraperitoneal bowel injury.   2 additional 8 mm ports were placed under direct visualization on the left lateral abdomen.    ?The robot was brought to the surgical field and docked in the standard fashion.  We made sure that all instrumentation was kept under direct vision at all times and there was no collision between the arms.  I scrubbed out and went to the console. ?Using a tips up fenestrated grasper and the fenestrated bipolar I was able to visualize the pelvis despite the extensively dilated loops of small bowel.  Identified there was some pelvic fluid despite a relatively steep Trendelenburg that kept me from visualizing the detail necessary.  ?I scrubbed back in in order to place a fourth port, an accessory port in the epigastrium to assist with aspirating fluid in the pelvis.  I was then able to visualize the distal ileal ileum, decompressed coming from the cecal junction, and followed it proximally down into the pelvis where identified the different dilated limb also disappearing into what appeared to be a hole.  I was able to reduce the contents of multiple loops of small bowel within this apparent hole, and once the loops were completely reduced identified a band extending from the sigmoid to the right pelvic sidewall. ?Ensuring no recurrence of this, I proceeded with division of this band utilizing bipolar cautery and blunt dissection. ?I then repeated  walking back the small bowel from the ileocecal valve well proximal to the point of obstruction confirming that all the small bowel was viable.  This then completed our  procedure. ?We undocked the robot, and withdrew the pneumoperitoneum.  ?Incisions were filled with Zynrelef, and closed with  4-0 Monocryl . ?Dermabond was used to coat the skin.  Patient tolerated procedure well and there were no immediate complications. Needle and laparotomy counts were correct ?  ? ?Campbell Lerner M.D., FACS ?07/20/2021 8:15 PM ?   ?

## 2021-08-11 NOTE — Transfer of Care (Signed)
Immediate Anesthesia Transfer of Care Note ? ?Patient: Rhonda Skinner ? ?Procedure(s) Performed: XI ROBOTIC ASSISTED LAPAROSCOPIC LYSIS OF ADHESION (Abdomen) ?EXPLORATORY LAPAROTOMY ? ?Patient Location: PACU ? ?Anesthesia Type:General ? ?Level of Consciousness: drowsy ? ?Airway & Oxygen Therapy: Patient Spontanous Breathing and Patient connected to face mask oxygen ? ?Post-op Assessment: Report given to RN and Post -op Vital signs reviewed and stable ? ?Post vital signs: stable ? ?Last Vitals:  ?Vitals Value Taken Time  ?BP 105/45   ?Temp 14F   ?Pulse 71 August 12, 2021 1521  ?Resp 29 12-Aug-2021 1521  ?SpO2 93 % 08/12/21 1521  ?Vitals shown include unvalidated device data. ? ?Last Pain:  ?Vitals:  ? 2021/08/12 0900  ?TempSrc:   ?PainSc: 2   ?   ? ?Patients Stated Pain Goal: 0 (08-12-2021 0900) ? ?Complications: No notable events documented. ?

## 2021-08-11 NOTE — Anesthesia Procedure Notes (Signed)
Procedure Name: Intubation ?Date/Time: 07/19/2021 1:44 PM ?Performed by: Loree Fee, CRNA ?Pre-anesthesia Checklist: Patient identified, Patient being monitored, Timeout performed, Emergency Drugs available and Suction available ?Patient Re-evaluated:Patient Re-evaluated prior to induction ?Oxygen Delivery Method: Circle system utilized ?Preoxygenation: Pre-oxygenation with 100% oxygen ?Induction Type: IV induction ?Ventilation: Mask ventilation without difficulty ?Laryngoscope Size: Mac and 3 ?Grade View: Grade I ?Tube type: Oral ?Tube size: 7.0 mm ?Number of attempts: 1 ?Airway Equipment and Method: Stylet ?Placement Confirmation: ETT inserted through vocal cords under direct vision, positive ETCO2 and breath sounds checked- equal and bilateral ?Secured at: 22 cm ?Tube secured with: Tape ?Dental Injury: Teeth and Oropharynx as per pre-operative assessment  ? ? ? ? ?

## 2021-08-11 NOTE — Plan of Care (Signed)

## 2021-08-11 NOTE — Anesthesia Preprocedure Evaluation (Signed)
Anesthesia Evaluation  ?Patient identified by MRN, date of birth, ID band ?Patient awake ? ? ? ?Reviewed: ?Allergy & Precautions, NPO status , Patient's Chart, lab work & pertinent test results, reviewed documented beta blocker date and time  ? ?History of Anesthesia Complications ?Negative for: history of anesthetic complications ? ?Airway ?Mallampati: II ? ?TM Distance: >3 FB ?Neck ROM: Full ? ? ? Dental ?no notable dental hx. ?(+) Poor Dentition, Teeth Intact ?  ?Pulmonary ?neg pulmonary ROS, neg sleep apnea, neg recent URI, Patient abstained from smoking.Not current smoker, neg PE ?On 4L N/C in hosptial ?  ?Pulmonary exam normal ?breath sounds clear to auscultation ? ? ? ? ? ? Cardiovascular ?Exercise Tolerance: Poor ?METS: 3 - Mets hypertension, Pt. on medications ?+CHF (Asx-  )  ?(-) Orthopnea, (-) PND and (-) DOE negative cardio ROS ?Normal cardiovascular exam(-) dysrhythmias + pacemaker II+ Valvular Problems/Murmurs  ?Rhythm:Regular Rate:Normal ? ?HOCM ?  ?Neuro/Psych ?PSYCHIATRIC DISORDERS Anxiety Depression negative neurological ROS ?   ? GI/Hepatic ?negative GI ROS, Neg liver ROS, GERD  ,Controlled GERD ?  ?Endo/Other  ?negative endocrine ROSdiabetes, Well Controlled, Type 2 ? Renal/GU ?negative Renal ROS  ?negative genitourinary ?  ?Musculoskeletal ? ?(+) Arthritis ,  ? Abdominal ?  ?Peds ?negative pediatric ROS ?(+)  Hematology ? ?(+) Blood dyscrasia, anemia ,   ?Anesthesia Other Findings ? ? Reproductive/Obstetrics ?negative OB ROS ? ?  ? ? ? ? ? ? ? ? ? ? ? ? ? ?  ?  ? ? ? ? ? ? ? ? ?Anesthesia Physical ?Anesthesia Plan ? ?ASA: 3 and emergent ? ?Anesthesia Plan: General  ? ?Post-op Pain Management:   ? ?Induction: Intravenous ? ?PONV Risk Score and Plan: Promethazine ? ?Airway Management Planned: Oral ETT ? ?Additional Equipment:  ? ?Intra-op Plan:  ? ?Post-operative Plan: Extubation in OR ? ?Informed Consent: I have reviewed the patients History and Physical, chart,  labs and discussed the procedure including the risks, benefits and alternatives for the proposed anesthesia with the patient or authorized representative who has indicated his/her understanding and acceptance.  ? ? ? ?Dental advisory given ? ?Plan Discussed with: CRNA ? ?Anesthesia Plan Comments: (Benefits and risk discussed with patient to include death, MI and CVA.  Pt wishes to proceed.)  ? ? ? ? ? ? ?Anesthesia Quick Evaluation ? ?

## 2021-08-11 NOTE — Progress Notes (Signed)
?  Progress Note ? ? ?Patient: Rhonda Skinner TML:[id number] DOB: 30-Jul-1945 DOA: 2021-08-14     5 ?DOS: the patient was seen and examined on 07/26/2021 ?  ?Assessment and Plan: ?* SBO (small bowel obstruction) (HCC) ?Patient to go to the operating room for small bowel obstruction surgery.  Case discussed with general surgery. ? ?Acute on chronic diastolic CHF (congestive heart failure) (HCC) ?Discontinue IV fluids.  Give a dose of Lasix this morning.  Chest x-ray yesterday did not show any heart failure or pneumonia.  Patient was on 4 L nasal cannula this morning when I saw her. ? ?Weakness ?Physical therapy changed the recommendations to rehab ? ?Hyponatremia ?Today sodium 131. ? ?Type 2 diabetes mellitus with hyperlipidemia (HCC) ?Holding Pravachol.  On sliding scale insulin.  Last outpatient hemoglobin A1c 6.4. ? ?Hypertension ?Follow-up patient on Coreg and diltiazem.  Holding Cozaar and Aldactone. ? ?Mood disorder (HCC) ?Continue BuSpar and Effexor ? ? ? ? ?  ? ?Subjective: Patient seen early morning and felt a little short of breath.  We discontinued fluids and gave a dose of IV Lasix.  Patient was admitted initially for bowel obstruction.  I have given some intermittent Lasix over the past few days for fluid overload. ? ?Physical Exam: ?Vitals:  ? 08/10/21 1616 08/10/21 1927 07/23/2021 0525 08/04/2021 0813  ?BP: (!) 129/49 (!) 114/46 125/73 136/61  ?Pulse: (!) 59 (!) 59 79 78  ?Resp: 19 18 19 18   ?Temp: 98.2 ?F (36.8 ?C) 98.6 ?F (37 ?C) 98.1 ?F (36.7 ?C) 97.7 ?F (36.5 ?C)  ?TempSrc:   Oral Oral  ?SpO2: 90% 94% 94% 95%  ?Weight:      ?Height:      ? ?Physical Exam ?HENT:  ?   Head: Normocephalic.  ?   Mouth/Throat:  ?   Pharynx: No oropharyngeal exudate.  ?Eyes:  ?   General: Lids are normal.  ?   Conjunctiva/sclera: Conjunctivae normal.  ?Cardiovascular:  ?   Rate and Rhythm: Normal rate and regular rhythm.  ?   Heart sounds: Normal heart sounds, S1 normal and S2 normal.  ?Pulmonary:  ?   Breath sounds:  Examination of the right-lower field reveals decreased breath sounds and rhonchi. Examination of the left-lower field reveals decreased breath sounds and rhonchi. Decreased breath sounds and rhonchi present. No wheezing or rales.  ?Abdominal:  ?   General: Bowel sounds are decreased. There is distension.  ?   Tenderness: There is generalized abdominal tenderness.  ?Musculoskeletal:  ?   Right ankle: No swelling.  ?   Left ankle: No swelling.  ?Skin: ?   General: Skin is warm.  ?   Findings: No rash.  ?Neurological:  ?   Mental Status: She is alert and oriented to person, place, and time.  ?  ?Data Reviewed: ?Sodium 131, creatinine 1.03, hemoglobin 11.7, white blood cell count 4.9 ? ?Family Communication: Declined ? ?Disposition: ?Status is: Inpatient ?Remains inpatient appropriate because: Going to the OR for bowel obstruction surgery. ? ?Planned Discharge Destination: Rehab ? ?Case discussed with general surgery ? ?Author: ? , MD ?07/17/2021 1:10 PM ? ?For on call review [email or web address].  ?

## 2021-08-12 ENCOUNTER — Encounter: Payer: Self-pay | Admitting: Surgery

## 2021-08-12 DIAGNOSIS — R531 Weakness: Secondary | ICD-10-CM | POA: Diagnosis not present

## 2021-08-12 DIAGNOSIS — I5033 Acute on chronic diastolic (congestive) heart failure: Secondary | ICD-10-CM | POA: Diagnosis not present

## 2021-08-12 DIAGNOSIS — K56609 Unspecified intestinal obstruction, unspecified as to partial versus complete obstruction: Secondary | ICD-10-CM | POA: Diagnosis not present

## 2021-08-12 DIAGNOSIS — E871 Hypo-osmolality and hyponatremia: Secondary | ICD-10-CM | POA: Diagnosis not present

## 2021-08-12 LAB — CBC
HCT: 32.2 % — ABNORMAL LOW (ref 36.0–46.0)
Hemoglobin: 11.2 g/dL — ABNORMAL LOW (ref 12.0–15.0)
MCH: 32.9 pg (ref 26.0–34.0)
MCHC: 34.8 g/dL (ref 30.0–36.0)
MCV: 94.7 fL (ref 80.0–100.0)
Platelets: 287 10*3/uL (ref 150–400)
RBC: 3.4 MIL/uL — ABNORMAL LOW (ref 3.87–5.11)
RDW: 11.9 % (ref 11.5–15.5)
WBC: 6.8 10*3/uL (ref 4.0–10.5)
nRBC: 0 % (ref 0.0–0.2)

## 2021-08-12 LAB — BASIC METABOLIC PANEL
Anion gap: 10 (ref 5–15)
BUN: 40 mg/dL — ABNORMAL HIGH (ref 8–23)
CO2: 24 mmol/L (ref 22–32)
Calcium: 7.2 mg/dL — ABNORMAL LOW (ref 8.9–10.3)
Chloride: 96 mmol/L — ABNORMAL LOW (ref 98–111)
Creatinine, Ser: 1.1 mg/dL — ABNORMAL HIGH (ref 0.44–1.00)
GFR, Estimated: 52 mL/min — ABNORMAL LOW (ref >60)
Glucose, Bld: 163 mg/dL — ABNORMAL HIGH (ref 70–99)
Potassium: 4.3 mmol/L (ref 3.5–5.1)
Sodium: 130 mmol/L — ABNORMAL LOW (ref 135–145)

## 2021-08-12 NOTE — Plan of Care (Signed)
Pt AAOx4, no pain. VS are stable. Plan to wean O2 and advance diet. Currently up to chair. Call light within reach. Will continue to monitor. ?

## 2021-08-12 NOTE — Progress Notes (Signed)
[facility] ?SURGICAL PROGRESS NOTE ? ?Hospital Day(s): 6.  ? ?Post op day(s): 1 Day Post-Op.  ? ?Interval History: Patient seen and examined, no acute events or new complaints overnight. Patient reports having flatus and bowel activity, and tolerating liquid diet.  She has no complaints of pain. ? ?Review of Systems:  ?Constitutional: denies fever, chills  ?Respiratory: denies any shortness of breath  ?Cardiovascular: denies chest pain or palpitations  ?Musculoskeletal: denies pain, decreased motor or sensation ?Integumentary: denies any other rashes or skin discolorations ? ?Vital signs in last 24 hours: [min-max] current  ?Temp:  [97.9 ?F (36.6 ?C)-98.8 ?F (37.1 ?C)] 98.2 ?F (36.8 ?C) (04/30 0747) ?Pulse Rate:  [68-87] 87 (04/30 0747) ?Resp:  [16-21] 18 (04/30 0747) ?BP: (105-118)/(45-70) 109/70 (04/30 0747) ?SpO2:  [90 %-96 %] 93 % (04/30 0747)     Height: 5\' 2"  (157.5 cm) Weight: 69.8 kg BMI (Calculated): 28.14  ? ?Intake/Output last 2 shifts:  ?04/29 0701 - 04/30 0700 ?In: 800 [I.V.:800] ?Out: 530 [Urine:500; Blood:5]  ? ?Physical Exam:  ?Constitutional: alert, cooperative and no distress  ?Respiratory: breathing non-labored at rest  ?Cardiovascular: regular rate and sinus rhythm  ?Gastrointestinal: soft, non-tender, and non-distended ?Integumentary: Incisions are clean dry and intact. ? ?Labs:  ? ?  Latest Ref Rng & Units 08/12/2021  ?  4:26 AM 08/09/2021  ?  4:38 AM 08/07/2021  ?  7:40 AM  ?CBC  ?WBC 4.0 - 10.5 K/uL 6.8   4.9   9.7    ?Hemoglobin 12.0 - 15.0 g/dL 11.2   11.7   12.7    ?Hematocrit 36.0 - 46.0 % 32.2   34.7   38.2    ?Platelets 150 - 400 K/uL 287   290   280    ? ? ?  Latest Ref Rng & Units 08/12/2021  ?  4:26 AM 07/17/2021  ?  4:38 AM 08/09/2021  ?  4:33 AM  ?CMP  ?Glucose 70 - 99 mg/dL 163   111   123    ?BUN 8 - 23 mg/dL 40   40   38    ?Creatinine 0.44 - 1.00 mg/dL 1.10   1.03   0.90    ?Sodium 135 - 145 mmol/L 130   131   135    ?Potassium 3.5 - 5.1 mmol/L 4.3   4.2   4.3     ?Chloride 98 - 111 mmol/L 96   95   102    ?CO2 22 - 32 mmol/L 24   27   26     ?Calcium 8.9 - 10.3 mg/dL 7.2   7.9   7.7    ? ? ? ?Imaging studies: No new pertinent imaging studies ? ? ?Assessment/Plan:  ?76 y.o. female with  1 Day Post-Op s/p lysis of band adhesion for small bowel obstruction, complicated by pertinent comorbidities including: ? ?Patient Active Problem List  ? Diagnosis Date Noted  ? Weakness 08/10/2021  ? Type 2 diabetes mellitus with hyperlipidemia ([place]) 08/08/2021  ? Hyponatremia 08/08/2021  ? SBO (small bowel obstruction) ([place]) 07/26/2021  ? Mood disorder ([place]) 08/10/2021  ? Hypercholesteremia 07/25/2021  ? Hypertension 07/22/2021  ? Acute on chronic diastolic CHF (congestive heart failure) ([place]) 08/03/2021  ? History of cardiac pacemaker 08/03/2021  ? Bronchitis 06/25/2017  ? ? -May advance diet as tolerated. ? -Continue PPI and DVT prophylaxis. ? -Anticipating discharge soon. ?  ? ?All of the above findings and recommendations were discussed with the patient, and all  of patient's questions were answered to their expressed satisfaction. ? ?-- ?Ronny Bacon, M.D., FACS ?08/12/2021  ?

## 2021-08-12 NOTE — Progress Notes (Signed)
?  Progress Note ? ? ?Patient: Rhonda Skinner XLK:[id number] DOB: Oct 28, 1945 DOA: 2021-09-02     6 ?DOS: the patient was seen and examined on 08/12/2021 ?  ? ?Assessment and Plan: ?* SBO (small bowel obstruction) (HCC) ?Patient is status post operation yesterday for lysis of adhesions.  Feeling better today.  Had Tylenol this morning. ? ?Acute on chronic diastolic CHF (congestive heart failure) (HCC) ?Patient on fluids and off Lasix at this point.  Down from 4 L this morning to 2 L.  Continue beta-blocker. ? ?Weakness ?Physical therapy continues to recommend rehab.  Patient interested in going home but will have to get stronger first. ? ?Hyponatremia ?Today sodium 130. ? ?Type 2 diabetes mellitus with hyperlipidemia (HCC) ?Holding Pravachol.  On sliding scale insulin.  Last outpatient hemoglobin A1c 6.4. ? ?Hypertension ?Follow-up patient on Coreg and diltiazem.  Holding Cozaar and Aldactone. ? ?Mood disorder (HCC) ?Continue BuSpar and Effexor ? ? ? ? ?  ? ?Subjective: Patient feeling better today.  Abdomen less distended and less tender.  She stated she took Tylenol for pain this morning.  Having bowel movements.  This morning was on 4 L of oxygen now down to 2 L. ? ?Physical Exam: ?Vitals:  ? 08/03/2021 1947 07/14/2021 2047 08/12/21 3664 08/12/21 0747  ?BP: (!) 113/52  (!) 109/53 109/70  ?Pulse: 76 74 72 87  ?Resp: 16 16 18 18   ?Temp: 98.7 ?F (37.1 ?C)  98.2 ?F (36.8 ?C) 98.2 ?F (36.8 ?C)  ?TempSrc: Oral  Oral Oral  ?SpO2: 96% 94% 93% 93%  ?Weight:      ?Height:      ? ?Physical Exam ?HENT:  ?   Head: Normocephalic.  ?   Mouth/Throat:  ?   Pharynx: No oropharyngeal exudate.  ?Eyes:  ?   General: Lids are normal.  ?   Conjunctiva/sclera: Conjunctivae normal.  ?Cardiovascular:  ?   Rate and Rhythm: Normal rate and regular rhythm.  ?   Heart sounds: Normal heart sounds, S1 normal and S2 normal.  ?Pulmonary:  ?   Breath sounds: Examination of the right-lower field reveals decreased breath sounds and rhonchi.  Examination of the left-lower field reveals decreased breath sounds and rhonchi. Decreased breath sounds and rhonchi present. No wheezing or rales.  ?Abdominal:  ?   Tenderness: There is generalized abdominal tenderness.  ?Musculoskeletal:  ?   Right ankle: No swelling.  ?   Left ankle: No swelling.  ?Skin: ?   General: Skin is warm.  ?   Findings: No rash.  ?Neurological:  ?   Mental Status: She is alert and oriented to person, place, and time.  ?  ?Data Reviewed: ?Creatinine 1.1, sodium 130, hemoglobin 11.2 ? ?Disposition: ?Status is: Inpatient ?Remains inpatient appropriate because: Postoperative day 1 for small bowel obstruction surgery. ? ?Planned Discharge Destination: Rehab ? ?Author: ? , MD ?08/12/2021 1:58 PM ? ?For on call review [email or web address].  ?

## 2021-08-12 NOTE — Evaluation (Signed)
Physical Therapy Evaluation ?Patient Details ?Name: Rhonda Skinner ?MRN: [id number] ?DOB: 31-May-1945 ?Today's Date: 08/12/2021 ? ?History of Present Illness ? Rhonda Skinner is a 89 yoF admitted 08/01/2021 for SBO after coming to ED with acute ABD pain.  PMH of sCHF, DMII, HTN, PPM. Pt evaluated by PT on 4/26 tolerating only short distances AMB up to 68ft, recurrent c/o dizziness when up. Pt taken to OR on 4/29 for lysis of band adhesions contributing to SBO.  ?Clinical Impression ? Pt admitted with above diagnosis. Pt currently with functional limitations due to the deficits listed below (see "PT Problem List"). Upon entry, pt in recliner, awake and agreeable to participate. The pt is alert, pleasant, interactive, and able to provide info regarding prior level of function, both in tolerance and independence. Pt asks for assist with bowel incontinence, is helped to [facility], then NA assists with pericare and costume change. Pt able to perform transfers and bed mobility at supervision level, no LOB. No pain in session. Pt able to AMB to door and back, a big improvement but short in distance and in speed to make DC to home the safest option today. Pt is hopeful that her AMB will advance over the next few days. Patient's performance this date reveals decreased ability, independence, and tolerance in performing all basic mobility required for performance of activities of daily living. Pt requires additional DME, close physical assistance, and cues for safe participate in mobility. Pt will benefit from skilled PT intervention to increase independence and safety with basic mobility in preparation for discharge to the venue listed below.   ? ?   ? ?Recommendations for follow up therapy are one component of a multi-disciplinary discharge planning process, led by the attending physician.  Recommendations may be updated based on patient status, additional functional criteria and insurance authorization. ? ?Follow Up  Recommendations Skilled nursing-short term rehab (<3 hours/day) ? ?  ?Assistance Recommended at Discharge Frequent or constant Supervision/Assistance  ?Patient can return home with the following ? A little help with walking and/or transfers;A little help with bathing/dressing/bathroom;Assistance with cooking/housework;Assist for transportation;Help with stairs or ramp for entrance;Direct supervision/assist for medications management ? ?  ?Equipment Recommendations Rolling walker (2 wheels)  ?Recommendations for Other Services ?    ?  ?Functional Status Assessment Patient has had a recent decline in their functional status and demonstrates the ability to make significant improvements in function in a reasonable and predictable amount of time.  ? ?  ?Precautions / Restrictions Precautions ?Precautions: Fall ?Restrictions ?Weight Bearing Restrictions: No  ? ?  ? ?Mobility ? Bed Mobility ?Overal bed mobility: Modified Independent ?  ?  ?  ?  ?Sit to supine: Modified independent (Device/Increase time) ?  ?  ?  ? ?Transfers ?Overall transfer level: Needs assistance ?Equipment used: Rolling walker (2 wheels) ?Transfers: Sit to/from Stand ?Sit to Stand: Supervision ?  ?  ?  ?  ?  ?  ?  ? ?Ambulation/Gait ?Ambulation/Gait assistance: Min guard ?Gait Distance (Feet): 50 Feet ?Assistive device: Rolling walker (2 wheels) ?Gait Pattern/deviations: WFL(Within Functional Limits) ?Gait velocity: 0.52m/s ?  ?Pre-gait activities: standing for pericare after BM and code brown floor ?General Gait Details: unable to go farther due to fatigue ? ?Stairs ?  ?  ?  ?  ?  ? ?Wheelchair Mobility ?  ? ?Modified Rankin (Stroke Patients Only) ?  ? ?  ? ?Balance   ?  ?  ?  ?  ?  ?  ?  ?  ?  ?  ?  ?  ?  ?  ?  ?  ?  ?  ?   ? ? ? ?  Pertinent Vitals/Pain Pain Assessment ?Pain Assessment: No/denies pain  ? ? ?Home Living Family/patient expects to be discharged to:: Private residence ?Living Arrangements: Spouse/significant other;Children (husband and  son (works daytime)) ?Available Help at Discharge: Family;Available 24 hours/day ?Type of Home: House ?Home Access: Stairs to enter ?Entrance Stairs-Rails: Right;Left ?Entrance Stairs-Number of Steps: 4 ?  ?Home Layout: One level ?Home Equipment: Gilmer Mor - single point;Wheelchair - Forensic psychologist (2 wheels);Shower seat ?Additional Comments: sleeps in a tregular bed  ?  ?Prior Function Prior Level of Function : Independent/Modified Independent ?  ?  ?  ?  ?  ?  ?Mobility Comments: household AMB at baseline, no falls history ?  ?  ? ? ?Hand Dominance  ?   ? ?  ?Extremity/Trunk Assessment  ? Upper Extremity Assessment ?Upper Extremity Assessment: Overall WFL for tasks assessed;Generalized weakness ?  ? ?Lower Extremity Assessment ?Lower Extremity Assessment: Overall WFL for tasks assessed;Generalized weakness ?  ? ?   ?Communication  ? Communication: No difficulties  ?Cognition Arousal/Alertness: Awake/alert ?Behavior During Therapy: [facility] for tasks assessed/performed ?Overall Cognitive Status: Within Functional Limits for tasks assessed ?  ?  ?  ?  ?  ?  ?  ?  ?  ?  ?  ?  ?  ?  ?  ?  ?  ?  ?  ? ?  ?General Comments   ? ?  ?Exercises    ? ?Assessment/Plan  ?  ?PT Assessment Patient needs continued PT services  ?PT Problem List Decreased strength;Decreased mobility;Decreased range of motion;Decreased activity tolerance;Decreased balance;Pain;Decreased knowledge of use of DME ? ?   ?  ?PT Treatment Interventions DME instruction;Therapeutic exercise;Gait training;Balance training;Stair training;Neuromuscular re-education;Functional mobility training;Therapeutic activities;Patient/family education   ? ?PT Goals (Current goals can be found in the Care Plan section)  ?Acute Rehab PT Goals ?Patient Stated Goal: get better so I can get home ?PT Goal Formulation: With patient ?Time For Goal Achievement: 08/26/21 ?Potential to Achieve Goals: Good ? ?  ?Frequency Min 2X/week ?  ? ? ?Co-evaluation   ?  ?  ?  ?  ? ? ?  ?AM-PAC PT  "6 Clicks" Mobility  ?Outcome Measure Help needed turning from your back to your side while in a flat bed without using bedrails?: A Lot ?Help needed moving from lying on your back to sitting on the side of a flat bed without using bedrails?: A Lot ?Help needed moving to and from a bed to a chair (including a wheelchair)?: A Lot ?Help needed standing up from a chair using your arms (e.g., wheelchair or bedside chair)?: A Lot ?Help needed to walk in hospital room?: A Lot ?Help needed climbing 3-5 steps with a railing? : A Lot ?6 Click Score: 12 ? ?  ?End of Session Equipment Utilized During Treatment: Gait belt ?Activity Tolerance: Other (comment);Patient limited by fatigue ?Patient left: in bed;with call bell/phone within reach;with bed alarm set ?Nurse Communication: Mobility status ?PT Visit Diagnosis: Other abnormalities of gait and mobility (R26.89);Difficulty in walking, not elsewhere classified (R26.2);Pain;Muscle weakness (generalized) (M62.81) ?  ? ?Time: 1157-2620 ?PT Time Calculation (min) (ACUTE ONLY): 30 min ? ? ?Charges:   PT Evaluation ?$PT Re-evaluation: 1 Re-eval ?PT Treatments ?$Therapeutic Exercise: 8-22 mins ?  ?   ? ?12:53 PM, 08/12/21 ?Rosamaria Lints, PT, DPT ?Physical Therapist - [place] ?[facility]  ?[phone] (ASCOM)  ? ? ?Latrese Carolan C ?08/12/2021, 12:50 PM ? ?

## 2021-08-13 ENCOUNTER — Inpatient Hospital Stay: Payer: Medicare PPO

## 2021-08-13 DIAGNOSIS — I5033 Acute on chronic diastolic (congestive) heart failure: Secondary | ICD-10-CM | POA: Diagnosis not present

## 2021-08-13 DIAGNOSIS — I951 Orthostatic hypotension: Secondary | ICD-10-CM

## 2021-08-13 DIAGNOSIS — R0602 Shortness of breath: Secondary | ICD-10-CM

## 2021-08-13 DIAGNOSIS — J9601 Acute respiratory failure with hypoxia: Secondary | ICD-10-CM | POA: Diagnosis not present

## 2021-08-13 DIAGNOSIS — B370 Candidal stomatitis: Secondary | ICD-10-CM | POA: Diagnosis not present

## 2021-08-13 DIAGNOSIS — K56609 Unspecified intestinal obstruction, unspecified as to partial versus complete obstruction: Secondary | ICD-10-CM | POA: Diagnosis not present

## 2021-08-13 LAB — MRSA NEXT GEN BY PCR, NASAL: MRSA by PCR Next Gen: NOT DETECTED

## 2021-08-13 LAB — GLUCOSE, CAPILLARY: Glucose-Capillary: 159 mg/dL — ABNORMAL HIGH (ref 70–99)

## 2021-08-13 MED ORDER — CARVEDILOL 6.25 MG PO TABS: 6.2500 mg | ORAL_TABLET | Freq: Two times a day (BID) | ORAL | Status: DC

## 2021-08-13 MED ORDER — SODIUM CHLORIDE 0.9 % IV BOLUS
500.0000 mL | Freq: Once | INTRAVENOUS | Status: AC
Start: 2021-08-13 — End: 2021-08-13
  Administered 2021-08-13: 500 mL via INTRAVENOUS

## 2021-08-13 MED ORDER — IOHEXOL 350 MG/ML SOLN
100.0000 mL | Freq: Once | INTRAVENOUS | Status: AC | PRN
  Administered 2021-08-13: 100 mL via INTRAVENOUS

## 2021-08-13 MED ORDER — CHLORHEXIDINE GLUCONATE CLOTH 2 % EX PADS
6.0000 | MEDICATED_PAD | Freq: Every day | CUTANEOUS | Status: DC
  Administered 2021-08-13 – 2021-08-16 (×3): 6 via TOPICAL

## 2021-08-13 MED ORDER — DILTIAZEM HCL ER COATED BEADS 120 MG PO CP24
120.0000 mg | ORAL_CAPSULE | Freq: Every day | ORAL | Status: DC
Start: 2021-08-14 — End: 2021-08-13

## 2021-08-13 MED ORDER — SODIUM CHLORIDE 0.9 % IV BOLUS: 250.0000 mL | Freq: Once | INTRAVENOUS | Status: DC

## 2021-08-13 MED ORDER — HEPARIN BOLUS VIA INFUSION
4000.0000 [IU] | Freq: Once | INTRAVENOUS | Status: AC
  Administered 2021-08-13: 4000 [IU] via INTRAVENOUS
  Filled 2021-08-13: qty 4000

## 2021-08-13 MED ORDER — HEPARIN (PORCINE) 25000 UT/250ML-% IV SOLN
900.0000 [IU]/h | INTRAVENOUS | Status: DC
  Administered 2021-08-13: 1100 [IU]/h via INTRAVENOUS
  Administered 2021-08-14: 850 [IU]/h via INTRAVENOUS
  Filled 2021-08-13 (×2): qty 250

## 2021-08-13 MED ORDER — FLUCONAZOLE 100 MG PO TABS
100.0000 mg | ORAL_TABLET | Freq: Every day | ORAL | Status: DC
  Administered 2021-08-13 – 2021-08-15 (×3): 100 mg via ORAL
  Filled 2021-08-13 (×3): qty 1

## 2021-08-13 MED ORDER — CARVEDILOL 6.25 MG PO TABS
3.1250 mg | ORAL_TABLET | Freq: Two times a day (BID) | ORAL | Status: DC
  Filled 2021-08-13: qty 1

## 2021-08-13 MED ORDER — DILTIAZEM HCL ER COATED BEADS 180 MG PO CP24: 180.0000 mg | ORAL_CAPSULE | Freq: Every day | ORAL | Status: DC

## 2021-08-13 MED ORDER — SODIUM CHLORIDE 0.9 % IV SOLN: INTRAVENOUS | Status: DC

## 2021-08-13 NOTE — Progress Notes (Signed)
[facility] ?SURGICAL PROGRESS NOTE ? ?Hospital Day(s): 7.  ? ?Post op day(s): 2 Days Post-Op.  ? ?Interval History:  ?Patient seen and examined ?No acute events or new complaints overnight.  ?Patient reports she feels "about the same" ?Still with some abdominal distension, incisional soreness ?No fever, chills, nausea, emesis ?No new labs this morning; no new imaging studies ?She is on FLD ?Having bowel function  ? ? ?Vital signs in last 24 hours: [min-max] current  ?Temp:  [97.8 ?F (36.6 ?C)-98.8 ?F (37.1 ?C)] 98.8 ?F (37.1 ?C) (05/01 0353) ?Pulse Rate:  [71-87] 72 (05/01 0353) ?Resp:  [18-23] 20 (05/01 0353) ?BP: (105-122)/(53-70) 122/57 (05/01 0353) ?SpO2:  [90 %-93 %] 92 % (05/01 0353)     Height: 5\' 2"  (157.5 cm) Weight: 69.8 kg BMI (Calculated): 28.14  ? ?Intake/Output last 2 shifts:  ?No intake/output data recorded.  ? ?Physical Exam:  ?Constitutional: alert, cooperative and no distress  ?Respiratory: breathing non-labored at rest  ?Cardiovascular: regular rate and sinus rhythm  ?Gastrointestinal: Soft, incisional soreness, she is still distended, no rebound/guarding ?Integumentary: Laparoscopic incisions are CDI with dermabond, no erythema or drainage  ? ?Labs:  ? ?  Latest Ref Rng & Units 08/12/2021  ?  4:26 AM 07/17/2021  ?  4:38 AM 08/07/2021  ?  7:40 AM  ?CBC  ?WBC 4.0 - 10.5 K/uL 6.8   4.9   9.7    ?Hemoglobin 12.0 - 15.0 g/dL 08/09/2021   81.0   17.5    ?Hematocrit 36.0 - 46.0 % 32.2   34.7   38.2    ?Platelets 150 - 400 K/uL 287   290   280    ? ? ?  Latest Ref Rng & Units 08/12/2021  ?  4:26 AM 08/04/2021  ?  4:38 AM 08/09/2021  ?  4:33 AM  ?CMP  ?Glucose 70 - 99 mg/dL 07/26/2021   585   277    ?BUN 8 - 23 mg/dL 40   40   38    ?Creatinine 0.44 - 1.00 mg/dL 824   2.35   3.61    ?Sodium 135 - 145 mmol/L 130   131   135    ?Potassium 3.5 - 5.1 mmol/L 4.3   4.2   4.3    ?Chloride 98 - 111 mmol/L 96   95   102    ?CO2 22 - 32 mmol/L 24   27   26     ?Calcium 8.9 - 10.3 mg/dL 7.2   7.9   7.7     ? ? ? ?Imaging studies: No new pertinent imaging studies ? ? ?Assessment/Plan:  ?76 y.o. female with ROBF and making slow progress 2 Days Post-Op s/p robotic assisted laparoscopic lysis of adhesions. ? ? - Will leave FLD for now; okay to advance to soft diet when ready  ? - Discontinue IVF resuscitation   ? - Monitor abdominal examination; on-going bowel function  ? - Pain control prn; antiemetics prn ?- Mobilization as tolerated; PT on board; recommendations are SNF ? - Further management per primary service ? ? - Discharge Planning; Pending advancement of diet; will likely benefit from another 24 hours; Hopefully this will also allow for progression with therapies.  ? ?All of the above findings and recommendations were discussed with the patient, and the medical team, and all of patient's questions were answered to her expressed satisfaction. ? ?-- ? , PA-C ?[facility] ?08/13/2021, 7:23 AM ?M-F: 7am - 4pm ? ?

## 2021-08-13 NOTE — Progress Notes (Signed)
?  Progress Note ? ? ?Patient: Rhonda Skinner ESP:[id number] DOB: 1946/03/13 DOA: 07/30/2021     7 ?DOS: the patient was seen and examined on 08/13/2021 ?  ? ? ?Assessment and Plan: ?* SBO (small bowel obstruction) (HCC) ?Patient is status post operation 08/12/21 for lysis of adhesions.  Feeling okay.  She did not want to go past the full liquid diet today.  Having bowel movements. ? ?Orthostatic hypotension ?Today was orthostatic with physical therapy.  I will give a fluid bolus and cut back on Coreg and Cardizem CD dosing. ? ?Acute on chronic diastolic CHF (congestive heart failure) (HCC) ?Watch closely.  Giving IV fluid bolus today.  Off Lasix at this point.  Likely had fluid overload from aggressive fluids from earlier in the hospital stay ? ?Thrush ?Start p.o. Diflucan today ? ?Weakness ?Physical therapy continues to recommend rehab.  Transitional care team to look into options. ? ?Hyponatremia ?Today sodium 130. ? ?Type 2 diabetes mellitus with hyperlipidemia (HCC) ?Holding Pravachol.  On sliding scale insulin.  Last outpatient hemoglobin A1c 6.4. ? ?Hypertension ?Cut back on Coreg and diltiazem dosing.  Holding Cozaar and Aldactone. ? ?Mood disorder (HCC) ?Continue BuSpar and Effexor ? ? ? ? ?  ? ?Subjective: Patient feels okay.  Still feeling very weak.  This morning felt like she was not ready for solid food.  Continues on full liquid diet this morning.  Admitted for small bowel obstruction. ? ?Physical Exam: ?Vitals:  ? 08/13/21 0353 08/13/21 0752 08/13/21 0753 08/13/21 1111  ?BP: (!) 122/57  118/63   ?Pulse: 72  72   ?Resp: 20  18   ?Temp: 98.8 ?F (37.1 ?C) 98.7 ?F (37.1 ?C)    ?TempSrc: Oral Oral    ?SpO2: 92%  91% (!) 88%  ?Weight:      ?Height:      ? ?Physical Exam ?HENT:  ?   Head: Normocephalic.  ?   Mouth/Throat:  ?   Pharynx: No oropharyngeal exudate.  ?Eyes:  ?   General: Lids are normal.  ?   Conjunctiva/sclera: Conjunctivae normal.  ?Cardiovascular:  ?   Rate and Rhythm: Normal rate and  regular rhythm.  ?   Heart sounds: Normal heart sounds, S1 normal and S2 normal.  ?Pulmonary:  ?   Breath sounds: Examination of the right-lower field reveals decreased breath sounds. Examination of the left-lower field reveals decreased breath sounds. Decreased breath sounds present. No wheezing, rhonchi or rales.  ?Abdominal:  ?   Tenderness: There is generalized abdominal tenderness.  ?Musculoskeletal:  ?   Right ankle: No swelling.  ?   Left ankle: No swelling.  ?Skin: ?   General: Skin is warm.  ?   Findings: No rash.  ?Neurological:  ?   Mental Status: She is alert and oriented to person, place, and time.  ?  ?Data Reviewed: ?Sodium 130 last creatinine 1.1 last hemoglobin 11.2 ? ?Family Communication: Declined ? ?Disposition: ?Status is: Inpatient ?Remains inpatient appropriate because: Recovering from small bowel obstruction surgery postoperative day 2 ?Planned Discharge Destination: Rehab ? ?Case discussed with general surgery ? ?Author: ?Alford Highland, MD ?08/13/2021 4:04 PM ? ?For on call review [email or web address].  ?

## 2021-08-13 NOTE — Progress Notes (Addendum)
Called that patient having more shortness of breath and BP still low.  Gave fluid bolus earlier.  Now back on 3.5L oxygen.  Patient more short of breath.  Slight wheeze heard.  Will hold off on further fluid boluses.  Give nebulizer treatment.  Start high flow nasal canula.  Will Get Ct angio of the chest to r/o PE and/or pneumonia.   ? ?Spoke with Patient, Family at the bedside, Respiratory and nursing staff. ? ?Dr Leslye Peer ? ?Case discussed with Dr Patsey Berthold Critical Care specialist.  Will transfer to stepdown unit for closer monitoring since placed on 12L high flow nasal canula.  Will add on Ct abd/pelvis also.  Will get echo for tomorrow. Will also get EKG. ? ?Spoke with family at the bedside again ? ?Dr Loletha Grayer ? ?30 minutes ?

## 2021-08-13 NOTE — Progress Notes (Signed)
ANTICOAGULATION CONSULT NOTE ? ?Pharmacy Consult for heparin infusion ?Indication: pulmonary embolus ? ?Allergies  ?Allergen Reactions  ? Oxycodone   ? Percocet [Oxycodone-Acetaminophen]   ? Phenobarbital   ? Vasotec [Enalapril Maleate]   ? ? ?Patient Measurements: ?Height: 5\' 2"  (157.5 cm) ?Weight: 74.1 kg (163 lb 5.8 oz) ?IBW/kg (Calculated) : 50.1 ?Heparin Dosing Weight: 66.1 kg ? ?Vital Signs: ?Temp: 98 ?F (36.7 ?C) (05/01 1654) ?Temp Source: Oral (05/01 1950) ?BP: 90/64 (05/01 1950) ?Pulse Rate: 96 (05/01 1950) ? ?Labs: ?Recent Labs  ?  07/28/2021 ?0438 08/12/21 ?0426  ?HGB 11.7* 11.2*  ?HCT 34.7* 32.2*  ?PLT 290 287  ?CREATININE 1.03* 1.10*  ? ? ?Estimated Creatinine Clearance: 41 mL/min (A) (by C-G formula based on SCr of 1.1 mg/dL (H)). ? ? ?Medical History: ?Past Medical History:  ?Diagnosis Date  ? Anxiety   ? Arthritis   ? CHF (congestive heart failure) (HCC)   ? Depression   ? Heart murmur   ? HOCM (hypertrophic obstructive cardiomyopathy) (HCC)   ? Hypercholesteremia   ? Hypertension   ? Osteoarthritis   ? Osteoporosis   ? Psychiatric illness   ? ? ?Medications:  ?Per chart review, no anticoagulation noted prior to admission.  ? ?Assessment: ?76yo female who is status post operation 08/12/21 for lysis of adhesions. Pharmacy consulted for heparin for pulmonary embolism.  ? ?5/1 CT chest: Two relatively small subsegmental left lower lobe and right middle lobe pulmonary emboli ? ?Goal of Therapy:  ?Heparin level 0.3-0.7 units/ml ?Monitor platelets by anticoagulation protocol: Yes ?  ?Plan:  ?Give 4000 units bolus x 1 ?Start heparin infusion at 1100 units/hr ?Check anti-Xa level in 8 hours and daily while on heparin ?Continue to monitor H&H and platelets ? ?Mahamud Metts O Jadalee Westcott ?08/13/2021,8:00 PM ? ? ?

## 2021-08-13 NOTE — Assessment & Plan Note (Addendum)
Holding all antihypertensive medications today ?

## 2021-08-13 NOTE — Progress Notes (Signed)
?   08/13/21 1111  ?Therapy Vitals  ?Patient Position (if appropriate) Orthostatic Vitals  ?Orthostatic Lying   ?BP- Lying 105/56  ?Pulse- Lying 68  ?Orthostatic Sitting  ?BP- Sitting 110/57  ?Pulse- Sitting 68  ?Orthostatic Standing at 0 minutes  ?BP- Standing at 0 minutes (!) 88/56 ?(presyncopal, returns to sitting immediately after BP assessment)  ?Pulse- Standing at 0 minutes 72  ?Oxygen Therapy  ?SpO2 (!) 88 %  ?O2 Device Room Air  ? ?Pt more somnolent upon arrival, on room air trial on entry as well at 88%. Pt noted to be orthostatic during visit, presyncopal both times up.  ? ?11:14 AM, 08/13/21 ?Rosamaria Lints, PT, DPT ?Physical Therapist - [place] ?[facility]  ?[phone] (ASCOM)  ? ?

## 2021-08-13 NOTE — Progress Notes (Signed)
Physical Therapy Treatment ?Patient Details ?Name: Rhonda Skinner ?MRN: [id number] ?DOB: 29-Oct-1945 ?Today's Date: 08/13/2021 ? ? ?History of Present Illness Rhonda Skinner is a 69 yoF admitted Aug 13, 2021 for SBO after coming to ED with acute ABD pain.  PMH of sCHF, DMII, HTN, PPM. Pt evaluated by PT on 4/26 tolerating only short distances AMB up to 72ft, recurrent Skinner/o dizziness when up. Pt taken to OR on 4/29 for lysis of band adhesions contributing to SBO. ? ?  ?PT Comments  ? ? Pt in room upon entry, mostly awake but heavily somnolent throughout. Pt is on a room air trial, received at 88%, ultimately put back on 2L/min for session to see if contributing to somnolence. Pt moving much mor epooorly, appears veyr weak and limited, only tolerates ~8-9 feet around foot of bed before she needs to sit in recliner. Subsequent BP check shows 80s/50s standing with presyncope after 45sec. MD made aware of pressures. Pt left up in recliner at EOS.  ? ?  ?Recommendations for follow up therapy are one component of a multi-disciplinary discharge planning process, led by the attending physician.  Recommendations may be updated based on patient status, additional functional criteria and insurance authorization. ? ?Follow Up Recommendations ? Skilled nursing-short term rehab (<3 hours/day) ?  ?  ?Assistance Recommended at Discharge Frequent or constant Supervision/Assistance  ?Patient can return home with the following A little help with walking and/or transfers;A little help with bathing/dressing/bathroom;Assistance with cooking/housework;Assist for transportation;Help with stairs or ramp for entrance;Direct supervision/assist for medications management ?  ?Equipment Recommendations ? Rolling walker (2 wheels)  ?  ?Recommendations for Other Services   ? ? ?  ?Precautions / Restrictions Precautions ?Precautions: Fall ?Restrictions ?Weight Bearing Restrictions: No  ?  ? ?Mobility ? Bed Mobility ?Overal bed mobility: Needs  Assistance ?  ?  ?  ?Supine to sit: Supervision ?  ?  ?  ?  ? ?Transfers ?Overall transfer level: Needs assistance ?Equipment used: Rolling walker (2 wheels) ?Transfers: Sit to/from Stand ?Sit to Stand: Min assist ?  ?  ?  ?  ?  ?  ?  ? ?Ambulation/Gait ?Ambulation/Gait assistance: Min guard ?Gait Distance (Feet): 8 Feet (final 5 ft requires >55sec) ?Assistive device: Rolling walker (2 wheels) ?  ?  ?  ?  ?General Gait Details: orthostatic presyncope, very weak and somnolent today ? ? ?Stairs ?  ?  ?  ?  ?  ? ? ?Wheelchair Mobility ?  ? ?Modified Rankin (Stroke Patients Only) ?  ? ? ?  ?Balance   ?  ?  ?  ?  ?  ?  ?  ?  ?  ?  ?  ?  ?  ?  ?  ?  ?  ?  ?  ? ?  ?Cognition Arousal/Alertness: Awake/alert ?Behavior During Therapy: [facility] for tasks assessed/performed ?Overall Cognitive Status: Within Functional Limits for tasks assessed ?  ?  ?  ?  ?  ?  ?  ?  ?  ?  ?  ?  ?  ?  ?  ?  ?  ?  ?  ? ?  ?Exercises   ? ?  ?General Comments   ?  ?  ? ?Pertinent Vitals/Pain Pain Assessment ?Pain Assessment: No/denies pain  ? ? ?Home Living   ?  ?  ?  ?  ?  ?  ?  ?  ?  ?   ?  ?Prior Function    ?  ?  ?   ? ?  PT Goals (current goals can now be found in the care plan section) Acute Rehab PT Goals ?Patient Stated Goal: get better so I can get home ?PT Goal Formulation: With patient ?Time For Goal Achievement: 08/26/21 ?Potential to Achieve Goals: Good ?Progress towards PT goals: Not progressing toward goals - comment ? ?  ?Frequency ? ? ? Min 2X/week ? ? ? ?  ?PT Plan Current plan remains appropriate  ? ? ?Co-evaluation   ?  ?  ?  ?  ? ?  ?AM-PAC PT "6 Clicks" Mobility   ?Outcome Measure ? Help needed turning from your back to your side while in a flat bed without using bedrails?: A Little ?Help needed moving from lying on your back to sitting on the side of a flat bed without using bedrails?: A Little ?Help needed moving to and from a bed to a chair (including a wheelchair)?: A Lot ?Help needed standing up from a chair using your arms  (e.g., wheelchair or bedside chair)?: A Lot ?Help needed to walk in hospital room?: A Lot ?Help needed climbing 3-5 steps with a railing? : A Lot ?6 Click Score: 14 ? ?  ?End of Session Equipment Utilized During Treatment: Gait belt ?Activity Tolerance: Patient limited by fatigue;Treatment limited secondary to medical complications (Comment) (orthostatic today) ?Patient left: with call bell/phone within reach;in chair ?Nurse Communication: Mobility status ?PT Visit Diagnosis: Other abnormalities of gait and mobility (R26.89);Difficulty in walking, not elsewhere classified (R26.2);Pain;Muscle weakness (generalized) (M62.81) ?  ? ? ?Time: 5993-5701 ?PT Time Calculation (min) (ACUTE ONLY): 27 min ? ?Charges:  $Therapeutic Exercise: 23-37 mins          ?          ?1:42 PM, 08/13/21 ?Rhonda Skinner, PT, DPT ?Physical Therapist - [place] ?[facility]  ?[phone] (ASCOM)  ? ? ? ?Rhonda Skinner ?08/13/2021, 1:40 PM ? ?

## 2021-08-13 NOTE — TOC Progression Note (Signed)
Transition of Care (TOC) - Progression Note  ? ? ?Patient Details  ?Name: Calisa Luckenbaugh ?MRN: [id number] ?Date of Birth: 02/08/1946 ? ?Transition of Care (TOC) CM/SW Contact  ?Chapman Fitch, RN ?Phone Number: ?08/13/2021, 2:45 PM ? ?Clinical Narrative:    ? ?Therapy recommending SNF.  ?Patient is hopeful for home discharge with home health services.  ? ?Agreeable to bed search ? ?Obtained PASRR ?Fl2 sent for signature ?Bed search initiated ? ?Husband update via phone ? ?Expected Discharge Plan: Home w Home Health Services ?Barriers to Discharge: Continued Medical Work up ? ?Expected Discharge Plan and Services ?Expected Discharge Plan: Home w Home Health Services ?  ?Discharge Planning Services: CM Consult ?Post Acute Care Choice: Home Health ?Living arrangements for the past 2 months: Single Family Home ?                ?DME Arranged: Walker rolling ?DME Agency: [employer] ?Date DME Agency Contacted: 08/09/21 ?  ?Representative spoke with at DME Agency: Zack ?HH Arranged: PT, OT, RN ?HH Agency: [facility] ?Date HH Agency Contacted: 08/09/21 ?  ?Representative spoke with at [facility] Agency: Kandee Keen ? ? ?Social Determinants of Health (SDOH) Interventions ?  ? ?Readmission Risk Interventions ?   ? View : No data to display.  ?  ?  ?  ? ? ?

## 2021-08-13 NOTE — Assessment & Plan Note (Addendum)
Continue Diflucan today ?

## 2021-08-13 NOTE — [place] FL2 (Signed)
?[place] MEDICAID FL2 LEVEL OF CARE SCREENING TOOL  ?  ? ?IDENTIFICATION  ?Patient Name: ?Rhonda Skinner Birthdate: 02-25-1946 Sex: female Admission Date (Current Location): ?07/18/2021  ?[place] Number: ?   ?  Facility and Address:  ?  ?     Provider Number: ?[id number]  ?Attending Physician Name and Address:  ?Alford Highland, MD ? Relative Name and Phone Number:  ?  ?   ?Current Level of Care: ?Hospital Recommended Level of Care: ?Skilled Nursing Facility Prior Approval Number: ?  ? ?Date Approved/Denied: ?  PASRR Number: ?[id number] A ? ?Discharge Plan: ?SNF ?  ? ?Current Diagnoses: ?Patient Active Problem List  ? Diagnosis Date Noted  ? Weakness 08/10/2021  ? Type 2 diabetes mellitus with hyperlipidemia (HCC) 08/08/2021  ? Hyponatremia 08/08/2021  ? SBO (small bowel obstruction) (HCC) 08/04/2021  ? Mood disorder (HCC) 08/05/2021  ? Hypercholesteremia 07/16/2021  ? Hypertension 07/19/2021  ? Acute on chronic diastolic CHF (congestive heart failure) (HCC) 07/27/2021  ? History of cardiac pacemaker 07/14/2021  ? Bronchitis 06/25/2017  ? ? ?Orientation RESPIRATION BLADDER Height & Weight   ?  ?Self, Time, Situation, Place ? O2 (1L [place]) Continent Weight: 69.8 kg ?Height:  5\' 2"  (157.5 cm)  ?BEHAVIORAL SYMPTOMS/MOOD NEUROLOGICAL BOWEL NUTRITION STATUS  ?    Incontinent Diet (Full[place], to advance prior to discharge)  ?AMBULATORY STATUS COMMUNICATION OF NEEDS Skin   ?Extensive Assist Verbally Surgical wounds ?  ?  ?  ?    ?     ?     ? ? ?Personal Care Assistance Level of Assistance  ?    ?  ?  ?   ? ?Functional Limitations Info  ?    ?  ?   ? ? ?SPECIAL CARE FACTORS FREQUENCY  ?PT (By licensed PT), OT (By licensed OT)   ?  ?  ?  ?  ?  ?  ?   ? ? ?Contractures Contractures Info: Not present  ? ? ?Additional Factors Info  ?Code Status, Allergies Code Status Info: Full ?Allergies Info: Oxycodone, Percocet (Oxycodone-acetaminophen), Phenobarbital, Vasotec (Enalapril Maleate) ?  ?  ?  ?   ? ?Current  Medications (08/13/2021):  This is the current hospital active medication list ?Current Facility-Administered Medications  ?Medication Dose Route Frequency Provider Last Rate Last Admin  ? acetaminophen (TYLENOL) tablet 650 mg  650 mg Oral Q6H PRN 10/13/2021, MD   650 mg at 08/13/21 1008  ? Or  ? acetaminophen (TYLENOL) suppository 650 mg  650 mg Rectal Q6H PRN 10/13/21, MD      ? busPIRone (BUSPAR) tablet 15 mg  15 mg Oral BID Jonah Blue, MD   15 mg at 08/13/21 1236  ? carvedilol (COREG) tablet 6.25 mg  6.25 mg Oral BID WC Wieting, Richard, MD      ? diltiazem (CARDIZEM CD) 24 hr capsule 360 mg  360 mg Oral Daily 10/13/21, MD   360 mg at 08/13/21 1007  ? fluconazole (DIFLUCAN) tablet 100 mg  100 mg Oral Daily 10/13/21, MD   100 mg at 08/13/21 1236  ? gabapentin (NEURONTIN) capsule 300 mg  300 mg Oral q morning 10/13/21 B, MD   300 mg at 08/13/21 1008  ? gabapentin (NEURONTIN) capsule 600 mg  600 mg Oral QHS 10/13/21, MD   600 mg at 08/12/21 2122  ? hydrALAZINE (APRESOLINE) injection 10 mg  10 mg Intravenous Q2H PRN 2123, MD      ?  ipratropium-albuterol (DUONEB) 0.5-2.5 (3) MG/3ML nebulizer solution 3 mL  3 mL Nebulization Q6H PRN Alford Highland, MD   3 mL at 08/12/2021 2046  ? MEDLINE mouth rinse  15 mL Mouth Rinse BID Alford Highland, MD   15 mL at 08/13/21 1008  ? menthol-cetylpyridinium (CEPACOL) lozenge 3 mg  1 lozenge Oral PRN Lolita Patella B, MD      ? morphine (PF) 2 MG/ML injection 1 mg  1 mg Intravenous Q3H PRN Alford Highland, MD   1 mg at 08/09/2021 2028  ? morphine (PF) 2 MG/ML injection 2 mg  2 mg Intravenous Q3H PRN Alford Highland, MD   2 mg at 07/31/2021 4680  ? ondansetron (ZOFRAN-ODT) disintegrating tablet 4 mg  4 mg Oral Q6H PRN Jonah Blue, MD      ? Or  ? ondansetron [facility]) injection 4 mg  4 mg Intravenous Q6H PRN Jonah Blue, MD   4 mg at 08/08/21 1225  ? pantoprazole (PROTONIX) injection 40 mg  40 mg Intravenous Q12H  Donovan Kail, PA-C   40 mg at 08/13/21 1008  ? phenol (CHLORASEPTIC) mouth spray 1 spray  1 spray Mouth/Throat PRN Lolita Patella B, MD   1 spray at 08/07/21 1753  ? simethicone (MYLICON) chewable tablet 80 mg  80 mg Oral Q6H PRN Donovan Kail, PA-C   80 mg at 08/10/21 3212  ? venlafaxine XR (EFFEXOR-XR) 24 hr capsule 75 mg  75 mg Oral Q breakfast Jonah Blue, MD   75 mg at 08/13/21 1007  ? ? ? ?Discharge Medications: ?Please see discharge summary for a list of discharge medications. ? ?Relevant Imaging Results: ? ?Relevant Lab Results: ? ? ?Additional Information ?ss [phone] ? ?Chapman Fitch, RN ? ? ? ? ?

## 2021-08-13 DEATH — deceased

## 2021-08-14 ENCOUNTER — Inpatient Hospital Stay
Admit: 2021-08-14 | Discharge: 2021-08-14 | Disposition: A | Discharge status: Home / Self Care | Payer: Medicare PPO | Attending: Internal Medicine | Admitting: Internal Medicine

## 2021-08-14 ENCOUNTER — Inpatient Hospital Stay: Payer: Medicare PPO

## 2021-08-14 DIAGNOSIS — J9601 Acute respiratory failure with hypoxia: Secondary | ICD-10-CM

## 2021-08-14 DIAGNOSIS — N179 Acute kidney failure, unspecified: Secondary | ICD-10-CM

## 2021-08-14 DIAGNOSIS — I2699 Other pulmonary embolism without acute cor pulmonale: Secondary | ICD-10-CM

## 2021-08-14 DIAGNOSIS — K56609 Unspecified intestinal obstruction, unspecified as to partial versus complete obstruction: Secondary | ICD-10-CM | POA: Diagnosis not present

## 2021-08-14 DIAGNOSIS — I5033 Acute on chronic diastolic (congestive) heart failure: Secondary | ICD-10-CM | POA: Diagnosis not present

## 2021-08-14 LAB — ECHOCARDIOGRAM COMPLETE
AR max vel: 2.7 cm2
AV Area VTI: 2.92 cm2
AV Area mean vel: 2.47 cm2
AV Mean grad: 7 mmHg
AV Peak grad: 10.5 mmHg
Ao pk vel: 1.62 m/s
Area-P 1/2: 2.34 cm2
Height: 62 in
MV VTI: 2.51 cm2
P 1/2 time: 486 msec
S' Lateral: 2.13 cm
Weight: 2613.77 oz

## 2021-08-14 LAB — CBC
HCT: 33.3 % — ABNORMAL LOW (ref 36.0–46.0)
Hemoglobin: 11.6 g/dL — ABNORMAL LOW (ref 12.0–15.0)
MCH: 33 pg (ref 26.0–34.0)
MCHC: 34.8 g/dL (ref 30.0–36.0)
MCV: 94.6 fL (ref 80.0–100.0)
Platelets: 306 10*3/uL (ref 150–400)
RBC: 3.52 MIL/uL — ABNORMAL LOW (ref 3.87–5.11)
RDW: 12.1 % (ref 11.5–15.5)
WBC: 10.3 10*3/uL (ref 4.0–10.5)
nRBC: 0 % (ref 0.0–0.2)

## 2021-08-14 LAB — BASIC METABOLIC PANEL
Anion gap: 10 (ref 5–15)
BUN: 49 mg/dL — ABNORMAL HIGH (ref 8–23)
CO2: 24 mmol/L (ref 22–32)
Calcium: 6.9 mg/dL — ABNORMAL LOW (ref 8.9–10.3)
Chloride: 96 mmol/L — ABNORMAL LOW (ref 98–111)
Creatinine, Ser: 1.21 mg/dL — ABNORMAL HIGH (ref 0.44–1.00)
GFR, Estimated: 46 mL/min — ABNORMAL LOW (ref >60)
Glucose, Bld: 129 mg/dL — ABNORMAL HIGH (ref 70–99)
Potassium: 4.3 mmol/L (ref 3.5–5.1)
Sodium: 130 mmol/L — ABNORMAL LOW (ref 135–145)

## 2021-08-14 LAB — HEPARIN LEVEL (UNFRACTIONATED)
Heparin Unfractionated: 1.1 IU/mL — ABNORMAL HIGH (ref 0.30–0.70)
Heparin Unfractionated: 0.73 IU/mL — ABNORMAL HIGH (ref 0.30–0.70)

## 2021-08-14 MED ORDER — FUROSEMIDE 10 MG/ML IJ SOLN
20.0000 mg | Freq: Once | INTRAMUSCULAR | Status: AC
  Administered 2021-08-14: 20 mg via INTRAVENOUS
  Filled 2021-08-14: qty 2

## 2021-08-14 NOTE — Plan of Care (Signed)
  Problem: Education: Goal: Knowledge of General Education information will improve Description: Including pain rating scale, medication(s)/side effects and non-pharmacologic comfort measures Outcome: Not Progressing   

## 2021-08-14 NOTE — Assessment & Plan Note (Addendum)
The patient was transferred to the ICU yesterday requiring 12 L high flow nasal cannula.  Yesterday desatted on nasal cannula pulse ox 84% on 7 L high flow nasal cannula.  Today she had a good pulse ox on the 12 L and we were able to dial her down to 8 L high flow nasal cannula.  Continue to taper.  Likely fluid overload component.  Careful with water pills with hypotension. ?

## 2021-08-14 NOTE — Progress Notes (Addendum)
[facility] ?SURGICAL PROGRESS NOTE ? ?Hospital Day(s): 8.  ? ?Post op day(s): 3 Days Post-Op.  ? ?Interval History:  ?Unfortunately throughout the day yesterday she became progressively worsening SOB and increase need for oxygen support. Was worked up with CTA Chest and CT Abdomen/Pelvis which showed small RLL and LLL emboli. She was transferred to step down unit, started on Heparin gtt, and PCCM consulted ? ?This morning, she endorses that she feels significantly better ?Abdomen is still distended, she does not appear tender ?No fever, chills, nausea, emesis ?She remains without leukocytosis; 10.3K ?Hgb remains stable at 11.6 ?Slight bump in renal function to 1.21; UO - 226 ?Mild hyponatremia to 130 ?She continued on FLD; has BM record ? ?Vital signs in last 24 hours: [min-max] current  ?Temp:  [98 ?F (36.7 ?C)-98.7 ?F (37.1 ?C)] 98.2 ?F (36.8 ?C) (05/02 0300) ?Pulse Rate:  [70-96] 71 (05/02 0600) ?Resp:  [13-26] 16 (05/02 0600) ?BP: (90-118)/(52-74) 103/63 (05/02 0600) ?SpO2:  [84 %-97 %] 97 % (05/02 0600) ?Weight:  [74.1 kg] 74.1 kg (05/01 1950)     Height: 5\' 2"  (157.5 cm) Weight: 74.1 kg BMI (Calculated): 29.87  ? ?Intake/Output last 2 shifts:  ?05/01 0701 - 05/02 0700 ?In: 875.3 [P.O.:740; I.V.:135.3] ?Out: 227 [Urine:226; Stool:1]  ? ?Physical Exam:  ?Constitutional: alert, cooperative and no distress  ?Respiratory: breathing non-labored at rest; on HFNC ?Cardiovascular: regular rate and sinus rhythm  ?Gastrointestinal: Soft, abdomen is distended, no longer appears tender, no rebound/guarding ?Integumentary: Laparoscopic incisions are CDI with dermabond, no erythema or drainage ? ?Labs:  ? ?  Latest Ref Rng & Units 08/14/2021  ?  6:08 AM 08/12/2021  ?  4:26 AM 07/20/2021  ?  4:38 AM  ?CBC  ?WBC 4.0 - 10.5 K/uL 10.3   6.8   4.9    ?Hemoglobin 12.0 - 15.0 g/dL 08/13/2021   79.8   92.1    ?Hematocrit 36.0 - 46.0 % 33.3   32.2   34.7    ?Platelets 150 - 400 K/uL 306   287   290    ? ? ?  Latest Ref Rng &  Units 08/14/2021  ?  6:08 AM 08/12/2021  ?  4:26 AM 07/16/2021  ?  4:38 AM  ?CMP  ?Glucose 70 - 99 mg/dL 08/13/2021   174   081    ?BUN 8 - 23 mg/dL 49   40   40    ?Creatinine 0.44 - 1.00 mg/dL 448   1.85   6.31    ?Sodium 135 - 145 mmol/L 130   130   131    ?Potassium 3.5 - 5.1 mmol/L 4.3   4.3   4.2    ?Chloride 98 - 111 mmol/L 96   96   95    ?CO2 22 - 32 mmol/L 24   24   27     ?Calcium 8.9 - 10.3 mg/dL 6.9   7.2   7.9    ? ? ? ?Imaging studies:  ? ?CTA Chest + CT Abdomen/Pelvis (08/13/2021) personally reviewed showing dilated loops of small bowel without obvious transition zone, decompressed colon, and PE noted, and radiologist report reviewed below:  ?IMPRESSION: ?1. Two relatively small subsegmental left lower lobe and right ?middle lobe pulmonary emboli, unlikely to result in significant RV ?strain. ?2. Multiple dilated proximal and mid small bowel loops, decompressed ?distal ileum. No discrete transition point or evident etiology. ?3. Small pleural and pericardial effusions. ?4. Sigmoid diverticulosis. ?5. Coronary and Aortic  Atherosclerosis (ICD10-170.0). ? ? ?Assessment/Plan:  ?76 y.o. female with SOB overnight (now improved) found to have two small subsegmental RLL/LLL PE and abdominal distension, likely ileus, 3 Days Post-Op s/p robotic assisted laparoscopic lysis of adhesions. ? ? - Would continue FLD for now; Would like to see distension improve and have more definitive bowel function before moving to soft diet ? - Monitor abdominal examination; on-going bowel function  ? - Pain control prn; antiemetics prn ?- Mobilization as tolerated; PT on board; recommendations are SNF ?- Appreciate PCCM assistance  ? - Further management per primary service ? ?All of the above findings and recommendations were discussed with the patient, and the medical team, and all of patient's questions were answered to her expressed satisfaction. ? ?-- ?Lynden Oxford, PA-C ?[facility] ?08/14/2021, 7:39 AM ?M-F: 7am -  4pm ? ?

## 2021-08-14 NOTE — Progress Notes (Signed)
?Progress Note ? ? ?Patient: Rhonda Skinner [id number] DOB: 1946-04-10 DOA: 07/15/2021     8 ?DOS: the patient was seen and examined on 08/14/2021 ?  ?Brief hospital course: ?76 year old female with past medical history of chronic diastolic congestive heart failure, pacemaker, hypertension, hyperlipidemia, type 2 diabetes mellitus and mood disorder presented with abdominal pain.  She was admitted to the hospital on 08/02/2021 and diagnosed with small bowel obstruction. ? ?General surgery was consulted and we did conservative management up until 07/14/2021 when Dr. Christian Mate took the patient to the operating room for lysis of band adhesions. ? ?During the hospital course patient has received intermittent Lasix. ? ?The patient had worsening clinical status on 08/13/2021 requiring transfer to the stepdown area for hypotension,shortness of breath and acute hypoxic respiratory failure.  She was placed on 12 L high flow nasal cannula.  CT scan of the chest showed 2 small pulmonary embolism and she was started on heparin drip.  CT scan of the abdomen showed an ileus and she is still on full liquid diet.  All of her antihypertensive medications were held. ? ?Echocardiogram and sonogram of the lower extremities ordered but still pending at the time of this dictation. ? ? ? ?Assessment and Plan: ?* Acute respiratory failure with hypoxia (HCC) ?The patient was transferred to the ICU yesterday requiring 12 L high flow nasal cannula.  Yesterday desatted on nasal cannula pulse ox 84% on 7 L high flow nasal cannula.  Today she had a good pulse ox on the 12 L and we were able to dial her down to 8 L high flow nasal cannula.  Continue to taper.  Likely fluid overload component.  Careful with water pills with hypotension. ? ?Acute pulmonary embolism ([place]) ?2 small pulmonary emboli seen on CT scan yesterday.  Started on heparin drip last night.  We will get an ultrasound of the lower extremities.  Echocardiogram pending.  I do  not think that this would be causing all of her symptoms. ? ?Acute on chronic diastolic CHF (congestive heart failure) ([place]) ?Watch closely.  If blood pressure rises may be able to give low-dose torsemide but will hold off for right now.  Echocardiogram pending. ? ?Orthostatic hypotension ?Holding all antihypertensive medications today ? ?Thrush ?Continue Diflucan today ? ?SBO (small bowel obstruction) ([place]) ?Patient is status post operation 08/12/21 for lysis of adhesions.  Postoperative ileus.  Full liquid diet.  General surgery team following. ? ?Weakness ?Physical therapy recommends rehab.  TOC looking into options. ? ?AKI (acute kidney injury) ([place]) ?Creatinine up to 1.21 today was 0.91 on 08/07/2021.  Holding off on giving fluids with respiratory issues.  Holding off on water pill at this point with relative hypotension.  Continue to monitor closely. ? ?Hyponatremia ?Today sodium 130. ? ?Type 2 diabetes mellitus with hyperlipidemia ([place]) ?Holding Pravachol.  On sliding scale insulin.  Last outpatient hemoglobin A1c 6.4. ? ?Hypertension ?Holding all antihypertensive medications currently. ? ?Mood disorder ([place]) ?Continue BuSpar and Effexor ? ? ? ? ?  ? ?Subjective: Patient feeling better this morning.  She slept well last night.  Yesterday had shortness of breath hypotension and required 12 L high flow nasal cannula in order to oxygenate.  She was diagnosed with 2 small pulmonary emboli. ? ?Physical Exam: ?Vitals:  ? 08/14/21 0700 08/14/21 0800 08/14/21 0900 08/14/21 1000  ?BP: 104/62 (!) 109/53 (!) 113/57 118/63  ?Pulse: 70 73 73 87  ?Resp: 17 19 (!) 22 (!) 22  ?Temp:  98.8 ?F (37.1 ?  C)    ?TempSrc:  Oral    ?SpO2: 98% 94% 96% 94%  ?Weight:      ?Height:      ? ?Physical Exam ?HENT:  ?   Head: Normocephalic.  ?   Mouth/Throat:  ?   Pharynx: No oropharyngeal exudate.  ?Eyes:  ?   General: Lids are normal.  ?   Conjunctiva/sclera: Conjunctivae normal.  ?Cardiovascular:  ?   Rate and Rhythm: Normal rate and regular  rhythm.  ?   Heart sounds: Normal heart sounds, S1 normal and S2 normal.  ?Pulmonary:  ?   Breath sounds: Examination of the right-lower field reveals decreased breath sounds. Examination of the left-lower field reveals decreased breath sounds. Decreased breath sounds present. No wheezing, rhonchi or rales.  ?Abdominal:  ?   General: There is distension.  ?   Tenderness: There is generalized abdominal tenderness.  ?Musculoskeletal:  ?   Right ankle: No swelling.  ?   Left ankle: No swelling.  ?Skin: ?   General: Skin is warm.  ?   Findings: No rash.  ?Neurological:  ?   Mental Status: She is alert and oriented to person, place, and time.  ?  ?Data Reviewed: ?Sodium 130, creatinine 1.21, hemoglobin 11.6 ?CT scan of the chest showing 2 small pulmonary emboli, CT scan of the abdomen showing ileus postoperatively ? ?Family Communication: Unable to reach husband on the phone but I did reach the son and give an update ? ?Disposition: ?Status is: Inpatient ?Remains inpatient appropriate because: Patient on high flow nasal cannula and being treated for postoperative ileus ? ?Planned Discharge Destination: Rehab ? ? ?Author: ?Loletha Grayer, MD ?08/14/2021 12:33 PM ? ?For on call review [email or web address].  ?

## 2021-08-14 NOTE — Progress Notes (Signed)
ANTICOAGULATION CONSULT NOTE ? ?Pharmacy Consult for heparin infusion ?Indication: pulmonary embolus ? ?Allergies  ?Allergen Reactions  ? Oxycodone   ? Percocet [Oxycodone-Acetaminophen]   ? Phenobarbital   ? Vasotec [Enalapril Maleate]   ? ? ?Patient Measurements: ?Height: 5\' 2"  (157.5 cm) ?Weight: 74.1 kg (163 lb 5.8 oz) ?IBW/kg (Calculated) : 50.1 ?Heparin Dosing Weight: 66.1 kg ? ?Vital Signs: ?Temp: 98.8 ?F (37.1 ?C) (05/02 0800) ?Temp Source: Oral (05/02 0800) ?BP: 131/72 (05/02 1500) ?Pulse Rate: 109 (05/02 1500) ? ?Labs: ?Recent Labs  ?  08/12/21 ?0426 08/14/21 ?0608 08/14/21 ?1526  ?HGB 11.2* 11.6*  --   ?HCT 32.2* 33.3*  --   ?PLT 287 306  --   ?HEPARINUNFRC  --  1.10* 0.73*  ?CREATININE 1.10* 1.21*  --   ? ? ? ?Estimated Creatinine Clearance: 37.3 mL/min (A) (by C-G formula based on SCr of 1.21 mg/dL (H)). ? ? ?Medical History: ?Past Medical History:  ?Diagnosis Date  ? Anxiety   ? Arthritis   ? CHF (congestive heart failure) (HCC)   ? Depression   ? Heart murmur   ? HOCM (hypertrophic obstructive cardiomyopathy) (HCC)   ? Hypercholesteremia   ? Hypertension   ? Osteoarthritis   ? Osteoporosis   ? Psychiatric illness   ? ? ?Medications:  ?Per chart review, no anticoagulation noted prior to admission.  ? ?Assessment: ?76yo female who is status post operation 08/12/21 for lysis of adhesions. Pharmacy consulted for heparin for pulmonary embolism.  ? ?5/1 CT chest: Two relatively small subsegmental left lower lobe and right middle lobe pulmonary emboli ? ?Goal of Therapy:  ?Heparin level 0.3-0.7 units/ml ?Monitor platelets by anticoagulation protocol: Yes ? ?5/02 0621 HL 1.1, supratherapeutic ?5/02 1526 HL 0.73, supratherapeutic ?  ?Plan:  ?Decrease heparin to 850 units/hr ?Recheck HL in 8 hours after rate change. ?Continue to monitor H&H and platelets ? ? ?7/02, PharmD ?Clinical Pharmacist  ?08/14/2021 5:36 PM  ? ? ? ? ?

## 2021-08-14 NOTE — Hospital Course (Signed)
76 year old female with past medical history of chronic diastolic congestive heart failure, pacemaker, hypertension, hyperlipidemia, type 2 diabetes mellitus and mood disorder presented with abdominal pain.  She was admitted to the hospital on 07/26/2021 and diagnosed with small bowel obstruction. ? ?General surgery was consulted and we did conservative management up until Sep 02, 2021 when Dr. Claudine Mouton took the patient to the operating room for lysis of band adhesions. ? ?During the hospital course patient has received intermittent Lasix. ? ?The patient had worsening clinical status on 08/13/2021 requiring transfer to the stepdown area for hypotension,shortness of breath and acute hypoxic respiratory failure.  She was placed on 12 L high flow nasal cannula.  CT scan of the chest showed 2 small pulmonary embolism and she was started on heparin drip.  CT scan of the abdomen showed an ileus and she is still on full liquid diet.  All of her antihypertensive medications were held. ? ?Echocardiogram and sonogram of the lower extremities ordered but still pending at the time of this dictation. ? ? ?

## 2021-08-14 NOTE — Progress Notes (Signed)
ANTICOAGULATION CONSULT NOTE ? ?Pharmacy Consult for heparin infusion ?Indication: pulmonary embolus ? ?Allergies  ?Allergen Reactions  ? Oxycodone   ? Percocet [Oxycodone-Acetaminophen]   ? Phenobarbital   ? Vasotec [Enalapril Maleate]   ? ? ?Patient Measurements: ?Height: 5\' 2"  (157.5 cm) ?Weight: 74.1 kg (163 lb 5.8 oz) ?IBW/kg (Calculated) : 50.1 ?Heparin Dosing Weight: 66.1 kg ? ?Vital Signs: ?Temp: 98.2 ?F (36.8 ?C) (05/02 0300) ?Temp Source: Oral (05/02 0300) ?BP: 103/63 (05/02 0600) ?Pulse Rate: 71 (05/02 0600) ? ?Labs: ?Recent Labs  ?  08/12/21 ?0426 08/14/21 ?0608  ?HGB 11.2* 11.6*  ?HCT 32.2* 33.3*  ?PLT 287 306  ?HEPARINUNFRC  --  1.10*  ?CREATININE 1.10* 1.21*  ? ? ? ?Estimated Creatinine Clearance: 37.3 mL/min (A) (by C-G formula based on SCr of 1.21 mg/dL (H)). ? ? ?Medical History: ?Past Medical History:  ?Diagnosis Date  ? Anxiety   ? Arthritis   ? CHF (congestive heart failure) (HCC)   ? Depression   ? Heart murmur   ? HOCM (hypertrophic obstructive cardiomyopathy) (HCC)   ? Hypercholesteremia   ? Hypertension   ? Osteoarthritis   ? Osteoporosis   ? Psychiatric illness   ? ? ?Medications:  ?Per chart review, no anticoagulation noted prior to admission.  ? ?Assessment: ?76yo female who is status post operation 08/12/21 for lysis of adhesions. Pharmacy consulted for heparin for pulmonary embolism.  ? ?5/1 CT chest: Two relatively small subsegmental left lower lobe and right middle lobe pulmonary emboli ? ?Goal of Therapy:  ?Heparin level 0.3-0.7 units/ml ?Monitor platelets by anticoagulation protocol: Yes ? ?5/02 0621 HL 1.1, supratherapeutic ?  ?Plan:  ?Decrease heparin to 900 units/hr ?Recheck HL in 8 hours after rate change. ?Continue to monitor H&H and platelets ? ?7/02, PharmD, MBA ?08/14/2021 ?7:03 AM ? ? ? ?

## 2021-08-14 NOTE — Consult Note (Addendum)
? ? ?NAME:  Rhonda Skinner, MRN:  [id number], DOB:  March 20, 1946, LOS: 8 ?ADMISSION DATE:  08/11/2021, CONSULTATION DATE:  08/13/21 ?REFERRING MD: Loletha Grayer, MD, REASON FOR CONSULT: Worsening Respiratory status ? ? ?HPI  ?76 y.o  female with significant PMH of chronic diastolic CHF, hypertrophic cardiomyopathy, sick sinus syndrome status post pacemaker, HTN, HLD, T2DM, and CKD stage III, who presented to the ED with chief complaints of RLQ pain associated with nausea vomiting and p.o. intolerance. ? ?ED Course: In the emergency department, the temperature was 36.5?C, the heart rate 72 beats/minute, the blood pressure  121/55mm Hg, the respiratory rate 16 breaths/minute, and the oxygen saturation 93% on RA. Pertinent Labs /Diagnostics Findings: Na+/ K+: 132/5.2, Glucose: 142, BUN/Cr.:  28/1.27, WBC/ TMAX: 13.5 afebrile.  CT abdomen pelvis showed dilated small bowel loops consistent with SBO.  Patient admitted to [place] unit under hospitalist service for observation and follow-up with general surgery. ? ?Hospital Course: Patient evaluated by general surgery who recommended conservative management as patient stable with no emergent surgical intervention.  On 4/25, NGT placed for decompression as patient with no significant improvement.  Further imaging showed persistent dilated loops of small bowel.  Patient started on clear liquids still complaining of mild abdominal bloating and pain.  4/29, patient taken to the OR for lysis of band adhesion for small bowel obstruction.  5/1, patient noted with increased shortness of breath and hypotension.  IV fluid bolus administered in place and placed back on 3.5 L of oxygen.  CT angio of the chest was obtained which showed small bilateral PE and small pleural and pericardial effusion patient transferred to stepdown unit for closer monitoring.  Due to high risk for decompensation, PCCM consulted. ?  ?Past Medical History  ?chronic diastolic CHF, hypertrophic  cardiomyopathy, sick sinus syndrome status post pacemaker, HTN, HLD, T2DM, and CKD stage III,  ? ?Significant Hospital Events   ?4/24: Admitted to MedSurg unit with small bowel obstruction ?4/29: Taken to the OR for lysis of band adhesion for small bowel obstruction ?5/1: Transferred to the ICU for acute respiratory distress and hypotension. PCCM consulted ? ?Consults:  ?General surgery ?PCCM ? ?Procedures:  ?4/29: Ex lap ? ?Significant Diagnostic Tests:  ?5/1: CTA abdomen and pelvis>Multiple dilated proximal and mid small bowel loops, decompressed distal ileum. No discrete transition point or evident etiology. Small pleural and pericardial effusions. ?5/1: CTA Chest>Two relatively small subsegmental left lower lobe and right middle lobe pulmonary emboli, unlikely to result in significant RV strain. ? ?Micro Data:  ?5/2: MRSA PCR>>  ? ?Antimicrobials:  ?None ? ?OBJECTIVE  ?Blood pressure (!) 98/53, pulse 70, temperature 98.2 ?F (36.8 ?C), temperature source Oral, resp. rate 16, height 5\' 2"  (1.575 m), weight 74.1 kg, SpO2 97 %. ?   ?   ? ?Intake/Output Summary (Last 24 hours) at 08/14/2021 0513 ?Last data filed at 08/14/2021 0300 ?Gross per 24 hour  ?Intake 664.25 ml  ?Output 2 ml  ?Net 662.25 ml  ? ?Filed Weights  ? 07/30/2021 1053 08/13/21 1950  ?Weight: 69.8 kg 74.1 kg  ? ?Physical Examination  ?GENERAL:76 year-old critically ill patient lying in the bed with no acute distress.  ?EYES: Pupils equal, round, reactive to light and accommodation. No scleral icterus. Extraocular muscles intact.  ?HEENT: Head atraumatic, normocephalic. Oropharynx and nasopharynx clear.  ?NECK:  Supple, no jugular venous distention. No thyroid enlargement, no tenderness.  ?LUNGS: Decreased breath sounds bilaterally, no wheezing, rales,rhonchi or crepitation. No use of accessory muscles of respiration.  ?  CARDIOVASCULAR: S1, S2 normal. No murmurs, rubs, or gallops.  ?ABDOMEN: Soft, tender to palpation, nondistended. Bowel sounds present. No  organomegaly or mass.  ?EXTREMITIES: No pedal edema, cyanosis, or clubbing.  ?NEUROLOGIC: Cranial nerves II through XII are intact.  Muscle strength 5/5 in all extremities. Sensation intact. Gait not checked.  ?PSYCHIATRIC: The patient is alert and oriented x 3.  ?SKIN: No obvious rash, lesion, or ulcer.  ? ?Labs/imaging that I havepersonally reviewed  ?(right click and "Reselect all SmartList Selections" daily)  ? ?  ?Labs   ?CBC: ?Recent Labs  ?Lab 08/07/21 ?0740 07/28/2021 ?[id number] 08/12/21 ?0426  ?WBC 9.7 4.9 6.8  ?HGB 12.7 11.7* 11.2*  ?HCT 38.2 34.7* 32.2*  ?MCV 97.4 95.1 94.7  ?PLT 280 290 287  ? ? ?Basic Metabolic Panel: ?Recent Labs  ?Lab 08/07/21 ?0740 08/09/21 ?[id number] 08/06/2021 ?[id number] 08/12/21 ?0426  ?NA 133* 135 131* 130*  ?K 4.4 4.3 4.2 4.3  ?CL 106 102 95* 96*  ?CO2 21* 26 27 24   ?GLUCOSE 112* 123* 111* 163*  ?BUN 29* 38* 40* 40*  ?CREATININE 0.91 0.90 1.03* 1.10*  ?CALCIUM 7.8* 7.7* 7.9* 7.2*  ?MG  --  2.5* 2.4  --   ? ?GFR: ?Estimated Creatinine Clearance: 41 mL/min (A) (by C-G formula based on SCr of 1.1 mg/dL (H)). ?Recent Labs  ?Lab 08/07/21 ?0740 07/22/2021 ?[id number] 08/12/21 ?0426  ?WBC 9.7 4.9 6.8  ? ? ?Liver Function Tests: ?No results for input(s): AST, ALT, ALKPHOS, BILITOT, PROT, ALBUMIN in the last 168 hours. ?No results for input(s): LIPASE, AMYLASE in the last 168 hours. ?No results for input(s): AMMONIA in the last 168 hours. ? ?ABG ?No results found for: PHART, PCO2ART, PO2ART, HCO3, TCO2, ACIDBASEDEF, O2SAT  ? ?Coagulation Profile: ?No results for input(s): INR, PROTIME in the last 168 hours. ? ?Cardiac Enzymes: ?No results for input(s): CKTOTAL, CKMB, CKMBINDEX, TROPONINI in the last 168 hours. ? ?HbA1C: ?No results found for: HGBA1C ? ?CBG: ?Recent Labs  ?Lab 08/13/21 ?2001  ?GLUCAP 159*  ? ? ?Review of Systems:   ?Review of Systems  ?Constitutional: Negative.   ?HENT: Negative.    ?Eyes: Negative.   ?Respiratory:  Positive for shortness of breath.   ?Cardiovascular: Negative.   ?Gastrointestinal:   Positive for abdominal pain, nausea and vomiting.  ?Genitourinary: Negative.   ?Musculoskeletal: Negative.   ?Skin: Negative.   ?Neurological: Negative.   ?Endo/Heme/Allergies: Negative.   ?Psychiatric/Behavioral:  Positive for depression. The patient is nervous/anxious and has insomnia.   ? ? ?Past Medical History  ?She,  has a past medical history of Anxiety, Arthritis, CHF (congestive heart failure) ([place]), Depression, Heart murmur, HOCM (hypertrophic obstructive cardiomyopathy) ([place]), Hypercholesteremia, Hypertension, Osteoarthritis, Osteoporosis, and Psychiatric illness.  ? ?Surgical History   ? ?Past Surgical History:  ?Procedure Laterality Date  ? ABDOMINAL HYSTERECTOMY    ? alcohol ablation of hocm    ? APPENDECTOMY    ? COLONOSCOPY    ? ESOPHAGOGASTRODUODENOSCOPY (EGD) WITH PROPOFOL N/A 03/30/2015  ? Procedure: ESOPHAGOGASTRODUODENOSCOPY (EGD) WITH PROPOFOL;  Surgeon: Hulen Luster, MD;  Location: [facility] ENDOSCOPY;  Service: Gastroenterology;  Laterality: N/A;  ? INSERT / REPLACE / REMOVE PACEMAKER    ? LAPAROTOMY N/A 07/20/2021  ? Procedure: EXPLORATORY LAPAROTOMY;  Surgeon: Ronny Bacon, MD;  Location: [facility] ORS;  Service: General;  Laterality: N/A;  ? ROBOTIC ASSISTED LAPAROSCOPIC LYSIS OF ADHESION N/A 07/16/2021  ? Procedure: XI ROBOTIC ASSISTED LAPAROSCOPIC LYSIS OF ADHESION;  Surgeon: Ronny Bacon, MD;  Location: [facility] ORS;  Service: General;  Laterality: N/A;  ? TUBAL LIGATION    ?  ? ?Social History  ? reports that she has never smoked. She has never used smokeless tobacco. She reports that she does not drink alcohol and does not use drugs.  ? ?Family History   ?Her family history includes Breast cancer in her sister; Cancer in her father; Heart Problems in her mother.  ? ?Allergies ?Allergies  ?Allergen Reactions  ? Oxycodone   ? Percocet [Oxycodone-Acetaminophen]   ? Phenobarbital   ? Vasotec [Enalapril Maleate]   ?  ? ?Home Medications  ?Prior to Admission medications   ?Medication Sig Start Date  End Date Taking? Authorizing Provider  ?acetaminophen (TYLENOL) 500 MG tablet Take 500 mg by mouth every 6 (six) hours as needed.   Yes [provider]  ?busPIRone (BUSPAR) 15 MG tablet Take 15 mg by mou

## 2021-08-14 NOTE — Assessment & Plan Note (Signed)
2 small pulmonary emboli seen on CT scan yesterday.  Started on heparin drip last night.  We will get an ultrasound of the lower extremities.  Echocardiogram pending.  I do not think that this would be causing all of her symptoms. ?

## 2021-08-14 NOTE — Assessment & Plan Note (Signed)
Creatinine up to 1.21 today was 0.91 on 08/07/2021.  Holding off on giving fluids with respiratory issues.  Holding off on water pill at this point with relative hypotension.  Continue to monitor closely. ?

## 2021-08-15 ENCOUNTER — Inpatient Hospital Stay: Payer: Medicare PPO

## 2021-08-15 ENCOUNTER — Inpatient Hospital Stay: Payer: Self-pay

## 2021-08-15 DIAGNOSIS — J9601 Acute respiratory failure with hypoxia: Secondary | ICD-10-CM | POA: Diagnosis not present

## 2021-08-15 DIAGNOSIS — I959 Hypotension, unspecified: Secondary | ICD-10-CM

## 2021-08-15 DIAGNOSIS — I2699 Other pulmonary embolism without acute cor pulmonale: Secondary | ICD-10-CM | POA: Diagnosis not present

## 2021-08-15 LAB — BLOOD GAS, ARTERIAL
Acid-base deficit: 3.4 mmol/L — ABNORMAL HIGH (ref 0.0–2.0)
Bicarbonate: 20.1 mmol/L (ref 20.0–28.0)
FIO2: 100 %
MECHVT: 450 mL
Mechanical Rate: 18
O2 Saturation: 96.7 %
PEEP: 8 cmH2O
Patient temperature: 37
pCO2 arterial: 31 mmHg — ABNORMAL LOW (ref 32–48)
pH, Arterial: 7.42 (ref 7.35–7.45)
pO2, Arterial: 79 mmHg — ABNORMAL LOW (ref 83–108)

## 2021-08-15 LAB — BASIC METABOLIC PANEL
Anion gap: 12 (ref 5–15)
BUN: 47 mg/dL — ABNORMAL HIGH (ref 8–23)
CO2: 22 mmol/L (ref 22–32)
Calcium: 7.1 mg/dL — ABNORMAL LOW (ref 8.9–10.3)
Chloride: 97 mmol/L — ABNORMAL LOW (ref 98–111)
Creatinine, Ser: 1.27 mg/dL — ABNORMAL HIGH (ref 0.44–1.00)
GFR, Estimated: 44 mL/min — ABNORMAL LOW (ref >60)
Glucose, Bld: 155 mg/dL — ABNORMAL HIGH (ref 70–99)
Potassium: 4.1 mmol/L (ref 3.5–5.1)
Sodium: 131 mmol/L — ABNORMAL LOW (ref 135–145)

## 2021-08-15 LAB — LACTIC ACID, PLASMA
Lactic Acid, Venous: 5.1 mmol/L (ref 0.5–1.9)
Lactic Acid, Venous: 2.6 mmol/L (ref 0.5–1.9)

## 2021-08-15 LAB — CBC
HCT: 34.7 % — ABNORMAL LOW (ref 36.0–46.0)
Hemoglobin: 11.8 g/dL — ABNORMAL LOW (ref 12.0–15.0)
MCH: 32.3 pg (ref 26.0–34.0)
MCHC: 34 g/dL (ref 30.0–36.0)
MCV: 95.1 fL (ref 80.0–100.0)
Platelets: 342 10*3/uL (ref 150–400)
RBC: 3.65 MIL/uL — ABNORMAL LOW (ref 3.87–5.11)
RDW: 12.3 % (ref 11.5–15.5)
WBC: 13.4 10*3/uL — ABNORMAL HIGH (ref 4.0–10.5)
nRBC: 0 % (ref 0.0–0.2)

## 2021-08-15 LAB — BRAIN NATRIURETIC PEPTIDE: B Natriuretic Peptide: 1074.2 pg/mL — ABNORMAL HIGH (ref 0.0–100.0)

## 2021-08-15 LAB — HEPARIN LEVEL (UNFRACTIONATED)
Heparin Unfractionated: 0.84 IU/mL — ABNORMAL HIGH (ref 0.30–0.70)
Heparin Unfractionated: 0.35 IU/mL (ref 0.30–0.70)

## 2021-08-15 LAB — MAGNESIUM: Magnesium: 2.5 mg/dL — ABNORMAL HIGH (ref 1.7–2.4)

## 2021-08-15 MED ORDER — ROCURONIUM BROMIDE 10 MG/ML (PF) SYRINGE
PREFILLED_SYRINGE | INTRAVENOUS | Status: AC
  Filled 2021-08-15: qty 10

## 2021-08-15 MED ORDER — SODIUM CHLORIDE 0.9% FLUSH: 10.0000 mL | INTRAVENOUS | Status: DC | PRN

## 2021-08-15 MED ORDER — PHENYLEPHRINE CONCENTRATED 100MG/250ML (0.4 MG/ML) INFUSION SIMPLE
0.0000 ug/min | INTRAVENOUS | Status: DC
  Administered 2021-08-15: 20 ug/min via INTRAVENOUS
  Administered 2021-08-16: 350 ug/min via INTRAVENOUS
  Filled 2021-08-15 (×2): qty 250

## 2021-08-15 MED ORDER — SODIUM CHLORIDE 0.9% FLUSH
10.0000 mL | Freq: Two times a day (BID) | INTRAVENOUS | Status: DC
  Administered 2021-08-15: 10 mL

## 2021-08-15 MED ORDER — NOREPINEPHRINE 4 MG/250ML-% IV SOLN: 0.0000 ug/min | INTRAVENOUS | Status: DC

## 2021-08-15 MED ORDER — ACETAMINOPHEN 325 MG PO TABS: 650.0000 mg | ORAL_TABLET | Freq: Four times a day (QID) | ORAL | Status: DC | PRN

## 2021-08-15 MED ORDER — VASOPRESSIN 20 UNITS/100 ML INFUSION FOR SHOCK
0.0000 [IU]/min | INTRAVENOUS | Status: DC
  Administered 2021-08-16: 0.03 [IU]/min via INTRAVENOUS
  Filled 2021-08-15: qty 100

## 2021-08-15 MED ORDER — FENTANYL 2500MCG IN NS 250ML (10MCG/ML) PREMIX INFUSION
INTRAVENOUS | Status: AC
  Administered 2021-08-15: 25 ug/h via INTRAVENOUS
  Filled 2021-08-15: qty 250

## 2021-08-15 MED ORDER — DILTIAZEM HCL ER COATED BEADS 180 MG PO CP24
360.0000 mg | ORAL_CAPSULE | Freq: Every day | ORAL | Status: DC
  Administered 2021-08-15: 360 mg via ORAL
  Filled 2021-08-15: qty 2

## 2021-08-15 MED ORDER — ROCURONIUM BROMIDE 10 MG/ML (PF) SYRINGE
50.0000 mg | PREFILLED_SYRINGE | Freq: Once | INTRAVENOUS | Status: AC
  Administered 2021-08-15: 50 mg via INTRAVENOUS

## 2021-08-15 MED ORDER — FUROSEMIDE 10 MG/ML IJ SOLN
40.0000 mg | Freq: Once | INTRAMUSCULAR | Status: AC
  Administered 2021-08-15: 40 mg via INTRAVENOUS
  Filled 2021-08-15: qty 4

## 2021-08-15 MED ORDER — DEXMEDETOMIDINE HCL IN NACL 400 MCG/100ML IV SOLN
0.0000 ug/kg/h | INTRAVENOUS | Status: DC
  Administered 2021-08-15: 0.4 ug/kg/h via INTRAVENOUS
  Administered 2021-08-16: 0.6 ug/kg/h via INTRAVENOUS
  Filled 2021-08-15: qty 100

## 2021-08-15 MED ORDER — FLUCONAZOLE 100MG IVPB
100.0000 mg | Freq: Every day | INTRAVENOUS | Status: DC
Start: 2021-08-16 — End: 2021-08-16
  Filled 2021-08-15: qty 50

## 2021-08-15 MED ORDER — CARVEDILOL 3.125 MG PO TABS
3.1250 mg | ORAL_TABLET | Freq: Two times a day (BID) | ORAL | Status: DC
  Administered 2021-08-15: 3.125 mg via ORAL
  Filled 2021-08-15: qty 1

## 2021-08-15 MED ORDER — GABAPENTIN 250 MG/5ML PO SOLN
600.0000 mg | Freq: Every day | ORAL | Status: DC
  Filled 2021-08-15: qty 12

## 2021-08-15 MED ORDER — FENTANYL CITRATE (PF) 100 MCG/2ML IJ SOLN
100.0000 ug | Freq: Once | INTRAMUSCULAR | Status: AC
  Administered 2021-08-15: 100 ug via INTRAVENOUS

## 2021-08-15 MED ORDER — AMIODARONE HCL IN DEXTROSE 360-4.14 MG/200ML-% IV SOLN
60.0000 mg/h | INTRAVENOUS | Status: AC
Start: 2021-08-15 — End: 2021-08-15
  Administered 2021-08-15 (×2): 60 mg/h via INTRAVENOUS
  Filled 2021-08-15: qty 200

## 2021-08-15 MED ORDER — SODIUM CHLORIDE 0.9 % IV SOLN: 250.0000 mL | INTRAVENOUS | Status: DC

## 2021-08-15 MED ORDER — NOREPINEPHRINE 4 MG/250ML-% IV SOLN: 2.0000 ug/min | INTRAVENOUS | Status: DC

## 2021-08-15 MED ORDER — FENTANYL CITRATE PF 50 MCG/ML IJ SOSY
PREFILLED_SYRINGE | INTRAMUSCULAR | Status: AC
  Filled 2021-08-15: qty 2

## 2021-08-15 MED ORDER — AMIODARONE HCL IN DEXTROSE 360-4.14 MG/200ML-% IV SOLN
30.0000 mg/h | INTRAVENOUS | Status: DC
Start: 2021-08-15 — End: 2021-08-16
  Administered 2021-08-15: 30 mg/h via INTRAVENOUS
  Filled 2021-08-15: qty 200

## 2021-08-15 MED ORDER — SODIUM CHLORIDE 0.9 % IV SOLN
250.0000 mL | INTRAVENOUS | Status: DC
Start: 2021-08-15 — End: 2021-08-16

## 2021-08-15 MED ORDER — DEXMEDETOMIDINE HCL IN NACL 400 MCG/100ML IV SOLN
INTRAVENOUS | Status: AC
  Filled 2021-08-15: qty 100

## 2021-08-15 MED ORDER — POLYETHYLENE GLYCOL 3350 17 G PO PACK
17.0000 g | PACK | Freq: Every day | ORAL | Status: DC
  Administered 2021-08-15: 17 g

## 2021-08-15 MED ORDER — LEVALBUTEROL HCL 1.25 MG/0.5ML IN NEBU
INHALATION_SOLUTION | RESPIRATORY_TRACT | Status: AC
  Filled 2021-08-15: qty 0.5

## 2021-08-15 MED ORDER — MIDAZOLAM HCL 2 MG/2ML IJ SOLN: 1.0000 mg | INTRAMUSCULAR | Status: DC | PRN

## 2021-08-15 MED ORDER — FLUCONAZOLE 100 MG PO TABS: 100.0000 mg | ORAL_TABLET | Freq: Every day | ORAL | Status: DC

## 2021-08-15 MED ORDER — FENTANYL 2500MCG IN NS 250ML (10MCG/ML) PREMIX INFUSION
25.0000 ug/h | INTRAVENOUS | Status: DC
  Administered 2021-08-15: 50 ug/h via INTRAVENOUS

## 2021-08-15 MED ORDER — ACETAMINOPHEN 650 MG RE SUPP
650.0000 mg | Freq: Four times a day (QID) | RECTAL | Status: DC | PRN
  Administered 2021-08-16: 650 mg via RECTAL
  Filled 2021-08-15: qty 1

## 2021-08-15 MED ORDER — ETOMIDATE 2 MG/ML IV SOLN
20.0000 mg | Freq: Once | INTRAVENOUS | Status: AC
Start: 2021-08-15 — End: 2021-08-15
  Administered 2021-08-15: 20 mg via INTRAVENOUS

## 2021-08-15 MED ORDER — DOCUSATE SODIUM 50 MG/5ML PO LIQD
100.0000 mg | Freq: Two times a day (BID) | ORAL | Status: DC
  Administered 2021-08-15: 100 mg

## 2021-08-15 MED ORDER — METOPROLOL TARTRATE 5 MG/5ML IV SOLN: 5.0000 mg | Freq: Once | INTRAVENOUS | Status: DC | PRN

## 2021-08-15 MED ORDER — METOPROLOL TARTRATE 5 MG/5ML IV SOLN
5.0000 mg | Freq: Four times a day (QID) | INTRAVENOUS | Status: DC | PRN
  Administered 2021-08-15: 5 mg via INTRAVENOUS
  Filled 2021-08-15 (×2): qty 5

## 2021-08-15 MED ORDER — PHENYLEPHRINE HCL-NACL 20-0.9 MG/250ML-% IV SOLN
25.0000 ug/min | INTRAVENOUS | Status: DC
  Administered 2021-08-15: 25 ug/min via INTRAVENOUS
  Filled 2021-08-15: qty 250

## 2021-08-15 MED ORDER — BUSPIRONE HCL 5 MG PO TABS
15.0000 mg | ORAL_TABLET | Freq: Two times a day (BID) | ORAL | Status: DC
  Filled 2021-08-15 (×2): qty 3

## 2021-08-15 MED ORDER — ETOMIDATE 2 MG/ML IV SOLN
INTRAVENOUS | Status: AC
  Filled 2021-08-15: qty 10

## 2021-08-15 MED ORDER — GABAPENTIN 250 MG/5ML PO SOLN
300.0000 mg | Freq: Every day | ORAL | Status: DC
  Filled 2021-08-15: qty 6

## 2021-08-15 MED ORDER — FENTANYL BOLUS VIA INFUSION
25.0000 ug | INTRAVENOUS | Status: DC | PRN
  Filled 2021-08-15: qty 100

## 2021-08-15 MED ORDER — METHYLPREDNISOLONE SODIUM SUCC 125 MG IJ SOLR
80.0000 mg | Freq: Once | INTRAMUSCULAR | Status: AC
  Administered 2021-08-15: 80 mg via INTRAVENOUS
  Filled 2021-08-15: qty 2

## 2021-08-15 NOTE — Progress Notes (Signed)
[facility] ?SURGICAL PROGRESS NOTE ? ?Hospital Day(s): 9.  ? ?Post op day(s): 4 Days Post-Op.  ? ?Interval History:  ?Patient seen and examined ?No acute events overnight ?This morning, she reports that she is very sleepy ?She is tachycardic to 130s this morning but she denied any SOB, CP, or palpitation  ?Abdomen is still distended, she does not appear tender ?No fever, chills, nausea, emesis ?Leukocytosis this morning to 13.4K ?Hgb remains stable at 11.8 ?Slight bump in renal function to 1.27; UO - 800 ccs + unmeasured  ?She continued on FLD; unclear if she is having bowel function; poor historian this morning  ? ?Vital signs in last 24 hours: [min-max] current  ?Temp:  [98.7 ?F (37.1 ?C)-98.9 ?F (37.2 ?C)] 98.9 ?F (37.2 ?C) (05/02 2300) ?Pulse Rate:  [32-138] 129 (05/03 0700) ?Resp:  [16-35] 18 (05/03 0700) ?BP: (86-134)/(37-114) 110/64 (05/03 0700) ?SpO2:  [87 %-97 %] 95 % (05/03 0700)     Height: 5\' 2"  (157.5 cm) Weight: 74.1 kg BMI (Calculated): 29.87  ? ?Intake/Output last 2 shifts:  ?05/02 0701 - 05/03 0700 ?In: 950.6 [P.O.:750; I.V.:200.6] ?Out: 800 [Urine:800]  ? ?Physical Exam:  ?Constitutional: alert, cooperative and no distress  ?Respiratory: breathing non-labored at rest; on HFNC ?Cardiovascular: tachycardic; appears sinus  ?Gastrointestinal: Soft, abdomen is distended (unchanged) and tympanic throughout, no longer appears tender, no rebound/guarding ?Integumentary: Laparoscopic incisions are CDI with dermabond, no erythema or drainage ? ?Labs:  ? ?  Latest Ref Rng & Units 08/15/2021  ?  1:53 AM 08/14/2021  ?  6:08 AM 08/12/2021  ?  4:26 AM  ?CBC  ?WBC 4.0 - 10.5 K/uL 13.4   10.3   6.8    ?Hemoglobin 12.0 - 15.0 g/dL 08/14/2021   07.3   71.0    ?Hematocrit 36.0 - 46.0 % 34.7   33.3   32.2    ?Platelets 150 - 400 K/uL 342   306   287    ? ? ?  Latest Ref Rng & Units 08/15/2021  ?  1:53 AM 08/14/2021  ?  6:08 AM 08/12/2021  ?  4:26 AM  ?CMP  ?Glucose 70 - 99 mg/dL 08/14/2021   948   546    ?BUN 8 - 23 mg/dL  47   49   40    ?Creatinine 0.44 - 1.00 mg/dL 270   3.50   0.93    ?Sodium 135 - 145 mmol/L 131   130   130    ?Potassium 3.5 - 5.1 mmol/L 4.1   4.3   4.3    ?Chloride 98 - 111 mmol/L 97   96   96    ?CO2 22 - 32 mmol/L 22   24   24     ?Calcium 8.9 - 10.3 mg/dL 7.1   6.9   7.2    ? ? ? ?Imaging studies: No new imaging studies  ? ? ?Assessment/Plan:  ?76 y.o. female with significant tachycardia and unchanged abdominal distension, 4 Days Post-Op s/p robotic assisted laparoscopic lysis of adhesions. ? ? - Would continue FLD for now; Would like to see distension improve and have more definitive bowel function before moving to soft diet ? - Monitor abdominal examination; on-going bowel function  ? - Pain control prn; antiemetics prn ?- Mobilization as tolerated; PT on board; recommendations are SNF ?- Appreciate PCCM assistance  ? - Further management per primary service ? ?All of the above findings and recommendations were discussed with the patient, and  the medical team, and all of patient's questions were answered to her expressed satisfaction. ? ?-- ?Lynden Oxford, PA-C ?[facility] ?08/15/2021, 8:40 AM ?M-F: 7am - 4pm ? ?

## 2021-08-15 NOTE — Progress Notes (Addendum)
ANTICOAGULATION CONSULT NOTE ? ?Pharmacy Consult for heparin infusion ?Indication: pulmonary embolus ? ?Allergies  ?Allergen Reactions  ? Oxycodone   ? Percocet [Oxycodone-Acetaminophen]   ? Phenobarbital   ? Vasotec [Enalapril Maleate]   ? ? ?Patient Measurements: ?Height: 5\' 2"  (157.5 cm) ?Weight: 74.1 kg (163 lb 5.8 oz) ?IBW/kg (Calculated) : 50.1 ?Heparin Dosing Weight: 66.1 kg ? ?Vital Signs: ?Temp: 98.9 ?F (37.2 ?C) (05/02 2300) ?Temp Source: Oral (05/02 2300) ?BP: 98/64 (05/03 0215) ?Pulse Rate: 123 (05/03 0215) ? ?Labs: ?Recent Labs  ?  08/12/21 ?0426 08/14/21 ?0608 08/14/21 ?1526 08/15/21 ?0153  ?HGB 11.2* 11.6*  --  11.8*  ?HCT 32.2* 33.3*  --  34.7*  ?PLT 287 306  --  342  ?HEPARINUNFRC  --  1.10* 0.73* 0.84*  ?CREATININE 1.10* 1.21*  --  1.27*  ? ? ? ?Estimated Creatinine Clearance: 35.5 mL/min (A) (by C-G formula based on SCr of 1.27 mg/dL (H)). ? ? ?Medical History: ?Past Medical History:  ?Diagnosis Date  ? Anxiety   ? Arthritis   ? CHF (congestive heart failure) ([facility])   ? Depression   ? Heart murmur   ? HOCM (hypertrophic obstructive cardiomyopathy) ([facility])   ? Hypercholesteremia   ? Hypertension   ? Osteoarthritis   ? Osteoporosis   ? Psychiatric illness   ? ? ?Medications:  ?Per chart review, no anticoagulation noted prior to admission.  ? ?Assessment: ?76yo female who is status post operation 08/12/21 for lysis of adhesions. Pharmacy consulted for heparin for pulmonary embolism.  ? ?5/1 CT chest: Two relatively small subsegmental left lower lobe and right middle lobe pulmonary emboli ? ?Goal of Therapy:  ?Heparin level 0.3-0.7 units/ml ?Monitor platelets by anticoagulation protocol: Yes ? ?5/02 0621 HL 1.1, supratherapeutic ?5/02 1526 HL 0.73, supratherapeutic ?5/03 0153 HL 0.84, supratherapeutic ?  ?Plan:  ?Decrease heparin to 700 units/hr ?Recheck HL in 8 hours after rate change. ?Continue to monitor H&H and platelets ? ? ?7/03, PharmD, MBA ?08/15/2021 ?2:50 AM ? ? ? ? ? ?

## 2021-08-15 NOTE — Progress Notes (Signed)
Critical lab called  ?Lactic 5.1  ?NP Rhonda Skinner and MD Kasa notified  ?

## 2021-08-15 NOTE — Procedures (Signed)
Endotracheal Intubation: ?Patient required placement of an artificial airway secondary to unresponsive state ?  ?Consent: Emergent.  ?  ?Hand washing performed prior to starting the procedure.  ?  ?Medications administered for sedation prior to procedure:  ?Fentanyl 100 mcg IV, 20 mg Etomidate IV, Rocuronium 50 mg IV ?  ?  ?A time out procedure was called and correct patient, name, & ID confirmed. Needed supplies and equipment were assembled and checked to include ETT, 10 ml syringe, Glidescope, Mac and Miller blades, suction, oxygen and bag mask valve, end tidal CO2 monitor.  ?  ?Patient was positioned to align the mouth and pharynx to facilitate visualization of the glottis.  ?  ?Heart rate, SpO2 and blood pressure was continuously monitored during the procedure. Pre-oxygenation was conducted prior to intubation and endotracheal tube was placed through the vocal cords into the trachea.  ?  ?  ? The artificial airway was placed under direct visualization via glidescope route using a 7.5 ETT on the first attempt. ?  ?ETT was secured at 22 cm at lip. ?  ?Placement was confirmed by auscuitation of lungs with good breath sounds bilaterally and no stomach sounds.  Condensation was noted on endotracheal tube.   ?Pulse ox 98 %. CO2 detector in place with appropriate color change.  ?  ?Complications: None .  ?  ?  ?  ?Chest radiograph ordered and pending.  ? ? ? ?Darel Hong, AGACNP-BC ?[facility] ?Prefer epic messenger for cross cover needs ?If after hours, please call E-link ? ? ?

## 2021-08-15 NOTE — Progress Notes (Signed)
PHARMACY CONSULT NOTE - FOLLOW UP ? ?Pharmacy Consult for Electrolyte Monitoring and Replacement  ? ?Recent Labs: ?Potassium (mmol/L)  ?Date Value  ?08/15/2021 4.1  ? ?Magnesium (mg/dL)  ?Date Value  ?08/15/2021 2.5 (H)  ? ?Calcium (mg/dL)  ?Date Value  ?08/15/2021 7.1 (L)  ? ?Albumin (g/dL)  ?Date Value  ?07/22/2021 4.0  ? ?Sodium (mmol/L)  ?Date Value  ?08/15/2021 131 (L)  ? ?Assessment: ?76yo female who is status post operation 08/12/21 for lysis of adhesions. Pharmacy consulted CCU monitoring. ?  ?General surgery following. Diet is being advanced slowly, patient still with abdominal distension. Pending resolution of abdominal distension and definitive return of bowel function before further advancement. ? ?Goal of Therapy:  ?Electrolytes within normal limits ? ?Plan:  ?No electrolytes warranted at this time ?Follow up labs in AM ? ?Jaynie Bream, PharmD ?Pharmacy Resident  ?08/15/2021 ?1:55 PM ?

## 2021-08-15 NOTE — Progress Notes (Signed)
Vital signs reviewed, ICU needs resolved  Will sign off at this time. No further recommendations at this time.     Gusta Marksberry David Jakarius Flamenco, M.D.  [facility]  Medical Director ICU-[facility] [place] Medical Director [facility] Cardio-P[facility] Department   

## 2021-08-15 NOTE — Progress Notes (Signed)
?PROGRESS NOTE ? ? ? ?Rhonda Skinner  RWE:[id number] DOB: 09/09/1945 DOA: 07/27/2021 ?PCP: Leim Fabry, MD  ? ? ?Assessment & Plan: ?  ?Principal Problem: ?  Acute respiratory failure with hypoxia (HCC) ?Active Problems: ?  Acute on chronic diastolic CHF (congestive heart failure) (HCC) ?  Acute pulmonary embolism (HCC) ?  Orthostatic hypotension ?  Thrush ?  SBO (small bowel obstruction) (HCC) ?  Weakness ?  Mood disorder (HCC) ?  Hypercholesteremia ?  Hypertension ?  History of cardiac pacemaker ?  Type 2 diabetes mellitus with hyperlipidemia (HCC) ?  Hyponatremia ?  Shortness of breath ?  AKI (acute kidney injury) (HCC) ? ?Assessment and Plan: ?Acute hypoxic respiratory failure: continue on supplemental oxygen and wean as tolerated, currently on HFNC. ICU re-consulted this afternoon for increased oxygen demand & respiratory decline  ?  ?Acute pulmonary embolism: continue on IV heparin. [place] of b/l LE was neg for DVT. Echo shows EF 55-60%, small pericardial effusion, mitral valve is degenerative & moderate MR, moderate AR.  ?  ?Acute on chronic diastolic CHF: echo as stated above. Unable to diruses currently secondary to hypotension. Monitor I/Os  ? ?A. Fib: likely PAF. W/ RVR. Started on IV amio  ?  ?Hypotension: will start on IV levophed, keep MAP >65 ?  ?Thrush: continue on fluconazole  ?  ?SBO s/p for lysis of adhesions on 08/12/21.  Postoperative ileus.  Full liquid diet.  Gen surg following and recs apprec  ?  ?Weakness: PT/OT consulted  ?  ?AKI: Cr is labile. Avoid nephrotoxic meds  ?  ?Hyponatremia: labile. Will continue to monitor  ? ?DM2: well controlled, HbA1c 6.4, outpatient. Continue on SSI w/ accuchecks  ?  ?HTN: holding BP meds. Started on IV levophed for hypotension  ?  ?Mood disorder: continue buspar, effexor  ? ? ? ? ? ? ?DVT prophylaxis: IV heparin  ?Code Status:  full  ?Family Communication: discussed pt's care w/ pt's family at bedside and answered their questions  ?Disposition  Plan: unclear ? ?Level of care: Progressive ? ?Status is: Inpatient ?Remains inpatient appropriate because: severity of illness. ICU will take over pt's care today  ? ? ? ?Consultants:  ?ICU ?A. fib ? ?Procedures:  ? ?Antimicrobials:  ? ? ?Subjective: ?Pt c/o fatigue  ? ?Objective: ?Vitals:  ? 08/15/21 0400 08/15/21 0500 08/15/21 0600 08/15/21 0700  ?BP: 96/64 100/66 (!) 103/58 110/64  ?Pulse: (!) 128 (!) 129 (!) 128 (!) 129  ?Resp: 17 18 19 18   ?Temp:      ?TempSrc:      ?SpO2: 95% 95% 95% 95%  ?Weight:      ?Height:      ? ? ?Intake/Output Summary (Last 24 hours) at 08/15/2021 0908 ?Last data filed at 08/15/2021 0500 ?Gross per 24 hour  ?Intake 950.58 ml  ?Output 800 ml  ?Net 150.58 ml  ? ?Filed Weights  ? 07/20/2021 1053 08/13/21 1950  ?Weight: 69.8 kg 74.1 kg  ? ? ?Examination: ? ?General exam: Appears lethargic  ?Respiratory system: diminished breath sounds b/l  ?Cardiovascular system: irregularly irregular. No rubs, gallops or clicks.  ?Gastrointestinal system: Abdomen is nondistended, soft and nontender. Normal bowel sounds heard. ?Central nervous system: lethargic but oriented. Moves all extremities ?Psychiatry: Judgement and insight appears not at baseline. Flat mood and affect ? ? ? ?Data Reviewed: I have personally reviewed following labs and imaging studies ? ?CBC: ?Recent Labs  ?Lab 09-07-21 ?08/13/21 08/12/21 ?0426 08/14/21 ?10/14/21 08/15/21 ?0153  ?WBC 4.9 6.8  10.3 13.4*  ?HGB 11.7* 11.2* 11.6* 11.8*  ?HCT 34.7* 32.2* 33.3* 34.7*  ?MCV 95.1 94.7 94.6 95.1  ?PLT 290 287 306 342  ? ?Basic Metabolic Panel: ?Recent Labs  ?Lab 08/09/21 ?0626 2021-08-30 ?9485 08/12/21 ?4627 08/14/21 ?0350 08/15/21 ?0153  ?NA 135 131* 130* 130* 131*  ?K 4.3 4.2 4.3 4.3 4.1  ?CL 102 95* 96* 96* 97*  ?CO2 26 27 24 24 22   ?GLUCOSE 123* 111* 163* 129* 155*  ?BUN 38* 40* 40* 49* 47*  ?CREATININE 0.90 1.03* 1.10* 1.21* 1.27*  ?CALCIUM 7.7* 7.9* 7.2* 6.9* 7.1*  ?MG 2.5* 2.4  --   --   --   ? ?GFR: ?Estimated Creatinine Clearance: 35.5 mL/min (A)  (by C-G formula based on SCr of 1.27 mg/dL (H)). ?Liver Function Tests: ?No results for input(s): AST, ALT, ALKPHOS, BILITOT, PROT, ALBUMIN in the last 168 hours. ?No results for input(s): LIPASE, AMYLASE in the last 168 hours. ?No results for input(s): AMMONIA in the last 168 hours. ?Coagulation Profile: ?No results for input(s): INR, PROTIME in the last 168 hours. ?Cardiac Enzymes: ?No results for input(s): CKTOTAL, CKMB, CKMBINDEX, TROPONINI in the last 168 hours. ?BNP (last 3 results) ?No results for input(s): PROBNP in the last 8760 hours. ?HbA1C: ?No results for input(s): HGBA1C in the last 72 hours. ?CBG: ?Recent Labs  ?Lab 08/13/21 ?2001  ?GLUCAP 159*  ? ?Lipid Profile: ?No results for input(s): CHOL, HDL, LDLCALC, TRIG, CHOLHDL, LDLDIRECT in the last 72 hours. ?Thyroid Function Tests: ?No results for input(s): TSH, T4TOTAL, FREET4, T3FREE, THYROIDAB in the last 72 hours. ?Anemia Panel: ?No results for input(s): VITAMINB12, FOLATE, FERRITIN, TIBC, IRON, RETICCTPCT in the last 72 hours. ?Sepsis Labs: ?No results for input(s): PROCALCITON, LATICACIDVEN in the last 168 hours. ? ?Recent Results (from the past 240 hour(s))  ?MRSA Next Gen by PCR, Nasal     Status: None  ? Collection Time: 08/13/21  7:51 PM  ? Specimen: Nasal Mucosa; Nasal Swab  ?Result Value Ref Range Status  ? MRSA by PCR Next Gen NOT DETECTED NOT DETECTED Final  ?  Comment: (NOTE) ?The GeneXpert MRSA Assay (FDA approved for NASAL specimens only), ?is one component of a comprehensive MRSA colonization surveillance ?program. It is not intended to diagnose MRSA infection nor to guide ?or monitor treatment for MRSA infections. ?Test performance is not FDA approved in patients less than 2 years ?old. ?Performed at [facility], [address] [place], ?[place] ?  ?  ? ? ? ? ? ?Radiology Studies: ?CT Angio Chest Pulmonary Embolism (PE) W or WO Contrast ? ?Result Date: 08/13/2021 ?CLINICAL DATA:  Chest pain or SOB, pleurisy or  effusion suspected; Abdominal pain, acute, nonlocalized EXAM: CT ANGIOGRAPHY CHEST CT ABDOMEN AND PELVIS WITH CONTRAST TECHNIQUE: Multidetector CT imaging of the chest was performed using the standard protocol during bolus administration of intravenous contrast. Multiplanar CT image reconstructions and MIPs were obtained to evaluate the vascular anatomy. Multidetector CT imaging of the abdomen and pelvis was performed using the standard protocol during bolus administration of intravenous contrast. RADIATION DOSE REDUCTION: This exam was performed according to the departmental dose-optimization program which includes automated exposure control, adjustment of the mA and/or kV according to patient size and/or use of iterative reconstruction technique. CONTRAST:  10/13/2021 OMNIPAQUE IOHEXOL 350 MG/ML SOLN COMPARISON:  07/28/2021 FINDINGS: CTA CHEST FINDINGS Cardiovascular: Right subclavian transvenous leads extend to the right atrium and towards the RV apex. Four-chamber cardiac enlargement. Small pericardial effusion as before. The RV  is nondilated. Good contrast opacification of pulmonary arterial tree. Subsegmental partially occlusive pulmonary emboli are noted in a posterior basal segment left lower lobe PA branch (Im177,Se5) and right middle lobe PA branch (Im141,Se5) . mitral annulus calcifications. Coronary calcifications. Incomplete opacification of the thoracic aorta, with scattered atheromatous plaque in the arch and descending segment. Mediastinum/Nodes: No mass or adenopathy. Lungs/Pleura: Small bilateral pleural effusions. Dependent atelectasis posteriorly in both lower lobes. Calcified granuloma in the superior segment right lower lobe, and lateral basal segment left lower lobe. Musculoskeletal: Spondylitic changes in the lower cervical, mid and lower thoracic spine. Review of the MIP images confirms the above findings. CT ABDOMEN and PELVIS FINDINGS Hepatobiliary: No focal liver abnormality is seen. No  gallstones, gallbladder wall thickening, or biliary dilatation. Pancreas: Unremarkable. No pancreatic ductal dilatation or surrounding inflammatory changes. Spleen: Normal in size without focal abnormality. Adrenals

## 2021-08-15 NOTE — Progress Notes (Signed)
PT Cancellation Note ? ?Patient Details ?Name: Rhonda Skinner ?MRN: [id number] ?DOB: July 15, 1945 ? ? ?Cancelled Treatment:    Reason Eval/Treat Not Completed: Medical issues which prohibited therapy ?Chart reviewed, vitals have continued to be an issue.  While observing pt in bed in CCU she looked to be in distress with just laying in bed; needing 8 L O2, HR ~120s even after getting metoprolol.  Spoke with nurse who recommended that PT hold today until she is more medically appropriate for activity.    ? ? ?Malachi Pro ?08/15/2021, 1:44 PM ?

## 2021-08-15 NOTE — Progress Notes (Signed)
ANTICOAGULATION CONSULT NOTE ? ?Pharmacy Consult for heparin infusion ?Indication: pulmonary embolus ? ?Patient Measurements: ?Height: 5\' 2"  (157.5 cm) ?Weight: 74.1 kg (163 lb 5.8 oz) ?IBW/kg (Calculated) : 50.1 ?Heparin Dosing Weight: 66.1 kg ? ?Labs: ?Recent Labs  ?  08/14/21 ?0608 08/14/21 ?1526 08/15/21 ?0153 08/15/21 ?1115  ?HGB 11.6*  --  11.8*  --   ?HCT 33.3*  --  34.7*  --   ?PLT 306  --  342  --   ?HEPARINUNFRC 1.10* 0.73* 0.84* 0.35  ?CREATININE 1.21*  --  1.27*  --   ? ? ? ?Estimated Creatinine Clearance: 35.5 mL/min (A) (by C-G formula based on SCr of 1.27 mg/dL (H)). ? ? ?Medical History: ?Past Medical History:  ?Diagnosis Date  ? Anxiety   ? Arthritis   ? CHF (congestive heart failure) ([place])   ? Depression   ? Heart murmur   ? HOCM (hypertrophic obstructive cardiomyopathy) ([place])   ? Hypercholesteremia   ? Hypertension   ? Osteoarthritis   ? Osteoporosis   ? Psychiatric illness   ? ? ?Medications:  ?Per chart review, no anticoagulation noted prior to admission.  ? ?Assessment: ?76yo female who is status post operation 08/12/21 for lysis of adhesions. Pharmacy consulted for heparin for pulmonary embolism.  ? ?5/1 CT chest: Two relatively small subsegmental left lower lobe and right middle lobe pulmonary emboli ? ?General surgery following. Diet is being advanced slowly, patient still with abdominal distension. Pending resolution of abdominal distension and definitive return of bowel function before further advancement. ? ?Patient is on HFNC at 8L and tachycardic today ? ?Goal of Therapy:  ?Heparin level 0.3-0.7 units/ml ?Monitor platelets by anticoagulation protocol: Yes ?  ?Plan:  ?Heparin level is therapeutic x 1 ?Continue heparin infusion at 700 units/hr ?Confirmatory HL in 8 hours ?Daily CBC per protocol while on IV heparin ?Follow-up transition to oral anticoagulation when abdominal issues resolve or as deemed appropriate by care team ? ? ?Benita Gutter  ?08/15/2021 ?11:51 AM ?

## 2021-08-15 NOTE — Consult Note (Addendum)
?[facility] CARDIOLOGY CONSULT NOTE  ? ?    ?Patient ID: ?Rhonda Skinner Rhonda Skinner ?MRN: [id number] ?DOB/AGE: 76-May-1947 76 y.o. ? ?Admit date: 07/23/2021 ?Referring Physician Dr. Fabienne Bruns  ?Primary Physician Dr. Jonell Cluck ?Primary Cardiologist Dr. Nolon Rod ?Reason for Consultation ?AF with RVR ? ?HPI: Rhonda Skinner is a 76 year old female with a past medical history of hypertrophic cardiomyopathy s/p alcohol septal ablation 07/1999 and 04/2000, sick sinus syndrome s/p dual-chamber [facility] [employer] permanent pacemaker 08/2003, generator change out 07/2015, hypertension, type 2 diabetes, CKD 3 who presented to [facility] ED 07/25/2021 with lower abdominal pain and distention CT abdomen pelvis was concerning for SBO.  She failed conservative management and underwent lysis of band adhesions Dr. Claudine Mouton 4/29.  She was hypotensive for working with physical therapy on 5/1 with an increasing oxygen requirement which required transfer to stepdown while on 12 L HFNC. Her home Coreg and Cardizem CD were held on 5/2 as a result.  CT chest showed 2 small pulmonary emboli and was started on a heparin drip.  Cardiology is consulted today due to possible A-fib with RVR overnight. ? ?The patient presents with her daughter and son-in-law who contribute to the history. They recount the story above with the patient having lower abdominal pain prior to presentation and successful surgical intervention. During interview this afternoon the patient says she feels okay, admits to abdominal pain and some shortness of breath. Denies pain in her chest, palpitations, dizziness, lower extremity edema. She had been doing well from a cardiac standpoint prior to admission, last seen by Dr. Modesto Charon at [facility] in November 2022. She denies prior history of atrial fibrillation, had a heart cath years ago with her septal ablations in the early 2000s. ? ?Review of telemetry strips reveals mostly sinus tachycardia with a brief period of a tachy  irregular rhythm the evening of 5/2 possibly consistent with atrial fibrillation, otherwise in sinus tachycardia with occasional V paced beats with rate mostly in the 120s this morning.  ?  ?Vitals are notable for a BP of 108/66 during interview and SPO2 92% on 8L HFNC. HR 126 during interview.  ? ?Review of systems complete and found to be negative unless listed above  ? ?Past Medical History:  ?Diagnosis Date  ? Anxiety   ? Arthritis   ? CHF (congestive heart failure) (HCC)   ? Depression   ? Heart murmur   ? HOCM (hypertrophic obstructive cardiomyopathy) (HCC)   ? Hypercholesteremia   ? Hypertension   ? Osteoarthritis   ? Osteoporosis   ? Psychiatric illness   ?  ?Past Surgical History:  ?Procedure Laterality Date  ? ABDOMINAL HYSTERECTOMY    ? alcohol ablation of hocm    ? APPENDECTOMY    ? COLONOSCOPY    ? ESOPHAGOGASTRODUODENOSCOPY (EGD) WITH PROPOFOL N/A 03/30/2015  ? Procedure: ESOPHAGOGASTRODUODENOSCOPY (EGD) WITH PROPOFOL;  Surgeon: Wallace Cullens, MD;  Location: [facility] ENDOSCOPY;  Service: Gastroenterology;  Laterality: N/A;  ? INSERT / REPLACE / REMOVE PACEMAKER    ? LAPAROTOMY N/A 07/21/2021  ? Procedure: EXPLORATORY LAPAROTOMY;  Surgeon: Campbell Lerner, MD;  Location: [facility] ORS;  Service: General;  Laterality: N/A;  ? ROBOTIC ASSISTED LAPAROSCOPIC LYSIS OF ADHESION N/A 07/28/2021  ? Procedure: XI ROBOTIC ASSISTED LAPAROSCOPIC LYSIS OF ADHESION;  Surgeon: Campbell Lerner, MD;  Location: [facility] ORS;  Service: General;  Laterality: N/A;  ? TUBAL LIGATION    ?  ?Medications Prior to Admission  ?Medication Sig Dispense Refill Last Dose  ? acetaminophen (TYLENOL) 500 MG  tablet Take 500 mg by mouth every 6 (six) hours as needed.   08/05/2021 at 1300  ? busPIRone (BUSPAR) 15 MG tablet Take 15 mg by mouth 2 (two) times daily.   08/05/2021  ? calcium-vitamin D (OSCAL WITH D) 500-200 MG-UNIT tablet Take 1 tablet by mouth.   Past Week  ? carvedilol (COREG) 25 MG tablet Take 25 mg by mouth 2 (two) times daily with a meal.    08/05/2021  ? desvenlafaxine (PRISTIQ) 50 MG 24 hr tablet Take 50 mg by mouth daily.   08/05/2021  ? diltiazem (TIAZAC) 360 MG 24 hr capsule Take 360 mg by mouth daily.   08/05/2021  ? ergocalciferol (VITAMIN D2) 50000 units capsule Take 50,000 Units by mouth once a week. On the first of every month   07/31/2021  ? famotidine (PEPCID) 40 MG tablet Take 40 mg by mouth daily.   08/05/2021  ? gabapentin (NEURONTIN) 300 MG capsule Take 600 mg by mouth at bedtime. Take 1 capsule (300MG ) by mouth every morning and 2 capsules (600MG ) by mouth every evening   08/05/2021  ? losartan (COZAAR) 100 MG tablet Take 100 mg by mouth daily.   08/05/2021  ? Multiple Vitamin (MULTIVITAMIN) tablet Take 1 tablet by mouth daily.   Past Week  ? pravastatin (PRAVACHOL) 80 MG tablet Take 80 mg by mouth daily.    08/05/2021  ? spironolactone (ALDACTONE) 25 MG tablet Take 25 mg by mouth daily.   08/05/2021  ? tiZANidine (ZANAFLEX) 2 MG tablet Take ? to 1 tablet by mouth three times daily as needed   prn  ? ?Social History  ? ?Socioeconomic History  ? Marital status: Married  ?  Spouse name: Not on file  ? Number of children: Not on file  ? Years of education: Not on file  ? Highest education level: Not on file  ?Occupational History  ? Not on file  ?Tobacco Use  ? Smoking status: Never  ? Smokeless tobacco: Never  ?Vaping Use  ? Vaping Use: Never used  ?Substance and Sexual Activity  ? Alcohol use: No  ? Drug use: No  ? Sexual activity: Not on file  ?Other Topics Concern  ? Not on file  ?Social History Narrative  ? Not on file  ? ?Social Determinants of Health  ? ?Financial Resource Strain: Not on file  ?Food Insecurity: Not on file  ?Transportation Needs: Not on file  ?Physical Activity: Not on file  ?Stress: Not on file  ?Social Connections: Not on file  ?Intimate Partner Violence: Not on file  ?  ?Family History  ?Problem Relation Age of Onset  ? Heart Problems Mother   ? Cancer Father   ? Breast cancer Sister   ?  ? ?PHYSICAL EXAM ?General:  Elderly and ill appearing Caucasian female, well nourished, in no acute distress. Laying at incline in PCU bed with daughter and son in law at bedside ?HEENT:  Normocephalic and atraumatic. ?Neck:  No JVD.  ?Lungs: Conversational dyspnea on HFNC.  Decreased breath sounds bilaterally without appreciable wheezes.   ?Heart: Tachycardic but regular. Normal S1 and S2, soft systolic murmur best heard at the apex. Radial pulses 2+ bilaterally. ?Abdomen: Distended appearing.  ?Msk: Normal strength and tone for age. ?Extremities: Warm and well perfused. No clubbing, cyanosis.  Trace bilateral lower extremity edema.  ?Neuro: Alert and oriented X 3. ?Psych:  Answers questions appropriately.  ? ?Labs: ?  ?Lab Results  ?Component Value Date  ? WBC 13.4 (H)  08/15/2021  ? HGB 11.8 (L) 08/15/2021  ? HCT 34.7 (L) 08/15/2021  ? MCV 95.1 08/15/2021  ? PLT 342 08/15/2021  ?  ?Recent Labs  ?Lab 08/15/21 ?0153  ?NA 131*  ?K 4.1  ?CL 97*  ?CO2 22  ?BUN 47*  ?CREATININE 1.27*  ?CALCIUM 7.1*  ?GLUCOSE 155*  ? ?No results found for: CKTOTAL, CKMB, CKMBINDEX, TROPONINI No results found for: CHOL ?No results found for: HDL ?No results found for: LDLCALC ?No results found for: TRIG ?No results found for: CHOLHDL ?No results found for: LDLDIRECT  ?  ?Radiology: DG Abd 1 View ? ?Result Date: 08/07/2021 ?CLINICAL DATA:  Nasogastric tube placement. EXAM: ABDOMEN - 1 VIEW COMPARISON:  Same day. FINDINGS: Distal tip of nasogastric tube is seen in proximal stomach. Stable small bowel dilatation is noted concerning for distal small bowel obstruction. IMPRESSION: Distal tip of nasogastric tube seen in proximal stomach. Electronically Signed   By: Lupita Raider M.D.   On: 08/07/2021 12:45  ? ?CT Angio Chest Pulmonary Embolism (PE) W or WO Contrast ? ?Result Date: 08/13/2021 ?CLINICAL DATA:  Chest pain or SOB, pleurisy or effusion suspected; Abdominal pain, acute, nonlocalized EXAM: CT ANGIOGRAPHY CHEST CT ABDOMEN AND PELVIS WITH CONTRAST TECHNIQUE:  Multidetector CT imaging of the chest was performed using the standard protocol during bolus administration of intravenous contrast. Multiplanar CT image reconstructions and MIPs were obtained to evaluate the vas

## 2021-08-15 NOTE — Consult Note (Addendum)
? ? ?NAME:  Rhonda Skinner, MRN:  [id number], DOB:  [birth year], LOS: 9 ?ADMISSION DATE:  07/16/2021, CONSULTATION DATE:  08/13/21 ?REFERRING MD: Loletha Grayer, MD, REASON FOR CONSULT: Worsening Respiratory status ? ? ?HPI  ?76 y.o  female with significant PMH of chronic diastolic CHF, hypertrophic cardiomyopathy, sick sinus syndrome status post pacemaker, HTN, HLD, T2DM, and CKD stage III, who presented to the ED with chief complaints of RLQ pain associated with nausea vomiting and p.o. intolerance. ? ?ED Course: In the emergency department, the temperature was 36.5?C, the heart rate 72 beats/minute, the blood pressure  121/9mm Hg, the respiratory rate 16 breaths/minute, and the oxygen saturation 93% on RA. Pertinent Labs /Diagnostics Findings: Na+/ K+: 132/5.2, Glucose: 142, BUN/Cr.:  28/1.27, WBC/ TMAX: 13.5 afebrile.  CT abdomen pelvis showed dilated small bowel loops consistent with SBO.  Patient admitted to [facility] under hospitalist service for observation and follow-up with general surgery. ? ?Hospital Course: Patient evaluated by general surgery who recommended conservative management as patient stable with no emergent surgical intervention.  On 4/25, NGT placed for decompression as patient with no significant improvement.  Further imaging showed persistent dilated loops of small bowel.  Patient started on clear liquids still complaining of mild abdominal bloating and pain.  4/29, patient taken to the OR for lysis of band adhesion for small bowel obstruction.  5/1, patient noted with increased shortness of breath and hypotension.  IV fluid bolus administered in place and placed back on 3.5 L of oxygen.  CT angio of the chest was obtained which showed small bilateral PE and small pleural and pericardial effusion patient transferred to stepdown unit for closer monitoring.  Due to high risk for decompensation, PCCM consulted. ?  ?Past Medical History  ?chronic diastolic CHF, hypertrophic  cardiomyopathy, sick sinus syndrome status post pacemaker, HTN, HLD, T2DM, and CKD stage III,  ? ?Significant Hospital Events   ?4/24: Admitted to MedSurg unit with small bowel obstruction ?4/29: Taken to the OR for lysis of band adhesion for small bowel obstruction ?5/1: Transferred to the ICU for acute respiratory distress and hypotension. PCCM consulted ? ?Consults:  ?General surgery ?PCCM ? ?Procedures:  ?4/29: Ex lap ? ?Significant Diagnostic Tests:  ?5/1: CTA abdomen and pelvis>Multiple dilated proximal and mid small bowel loops, decompressed distal ileum. No discrete transition point or evident etiology. Small pleural and pericardial effusions. ?5/1: CTA Chest>Two relatively small subsegmental left lower lobe and right middle lobe pulmonary emboli, unlikely to result in significant RV strain. ?5/3 This AM patient deemed more stable ?5/3 called to bedside at 1:45PM, increased WOB looks pale and high risk for intubation ? ? ? ?Micro Data:  ?5/2: MRSA PCR>>  ? ?Antimicrobials:  ?None ? ? ? ?INTERVAL CHANGES ?Patient with acute SOB ?Acute resp distress ?Very pale ?High risk for intubation ?High risk for cardiac arrest ?Placed on biPAP ? ? ? ? ? ? ? ? ?OBJECTIVE  ?Blood pressure 99/68, pulse (!) 126, temperature 97.9 ?F (36.6 ?C), temperature source Oral, resp. rate 20, height 5\' 2"  (1.575 m), weight 74.1 kg, SpO2 95 %. ?   ?   ? ?Intake/Output Summary (Last 24 hours) at 08/15/2021 1346 ?Last data filed at 08/15/2021 1223 ?Gross per 24 hour  ?Intake 603.7 ml  ?Output 950 ml  ?Net -346.3 ml  ? ? ?Filed Weights  ? 07/27/2021 1053 08/13/21 1950  ?Weight: 69.8 kg 74.1 kg  ? ? ? ?REVIEW OF SYSTEMS ? ?PATIENT IS UNABLE TO PROVIDE COMPLETE REVIEW OF SYSTEMS  DUE TO SEVERE CRITICAL ILLNESS AND TOXIC METABOLIC ENCEPHALOPATHY ? ? ? ?PHYSICAL EXAMINATION: ? ?GENERAL:critically ill appearing, +resp distress ?EYES: Pupils equal, round, reactive to light.  No scleral icterus.  ?MOUTH: Moist mucosal membrane. ?NECK: Supple.   ?PULMONARY: +rhonchi, +wheezing ?CARDIOVASCULAR: S1 and S2.  No murmurs  ?GASTROINTESTINAL: Soft, nontender, -distended. Positive bowel sounds.  ?MUSCULOSKELETAL: No swelling, clubbing, or edema.  ?NEUROLOGIC: obtunded ?SKIN:intact,warm,dry ? ? ? ? ?Labs/imaging that I havepersonally reviewed  ?(right click and "Reselect all SmartList Selections" daily)  ? ?  ?Labs   ?CBC: ?Recent Labs  ?Lab 08/10/2021 ?[id number] 08/12/21 ?0426 08/14/21 ?[id number] 08/15/21 ?0153  ?WBC 4.9 6.8 10.3 13.4*  ?HGB 11.7* 11.2* 11.6* 11.8*  ?HCT 34.7* 32.2* 33.3* 34.7*  ?MCV 95.1 94.7 94.6 95.1  ?PLT 290 287 306 342  ? ? ? ?Basic Metabolic Panel: ?Recent Labs  ?Lab 08/09/21 ?[id number] 08/06/2021 ?[id number] 08/12/21 ?[id number] 08/14/21 ?[id number] 08/15/21 ?0153  ?NA 135 131* 130* 130* 131*  ?K 4.3 4.2 4.3 4.3 4.1  ?CL 102 95* 96* 96* 97*  ?CO2 26 27 24 24 22   ?GLUCOSE 123* 111* 163* 129* 155*  ?BUN 38* 40* 40* 49* 47*  ?CREATININE 0.90 1.03* 1.10* 1.21* 1.27*  ?CALCIUM 7.7* 7.9* 7.2* 6.9* 7.1*  ?MG 2.5* 2.4  --   --   --   ? ? ?GFR: ?Estimated Creatinine Clearance: 35.5 mL/min (A) (by C-G formula based on SCr of 1.27 mg/dL (H)). ?Recent Labs  ?Lab 07/28/2021 ?[id number] 08/12/21 ?0426 08/14/21 ?[id number] 08/15/21 ?0153  ?WBC 4.9 6.8 10.3 13.4*  ? ? ?CBG: ?Recent Labs  ?Lab 08/13/21 ?2001  ?GLUCAP 159*  ? ? ?Allergies ?Allergies  ?Allergen Reactions  ? Oxycodone   ? Percocet [Oxycodone-Acetaminophen]   ? Phenobarbital   ? Vasotec [Enalapril Maleate]   ?  ? ?Home Medications  ?Prior to Admission medications   ?Medication Sig Start Date End Date Taking? Authorizing Provider  ?acetaminophen (TYLENOL) 500 MG tablet Take 500 mg by mouth every 6 (six) hours as needed.   Yes [provider]  ?busPIRone (BUSPAR) 15 MG tablet Take 15 mg by mouth 2 (two) times daily.   Yes [provider]  ?calcium-vitamin D (OSCAL WITH D) 500-200 MG-UNIT tablet Take 1 tablet by mouth.   Yes [provider]  ?carvedilol (COREG) 25 MG tablet Take 25 mg by mouth 2 (two) times daily with a  meal.   Yes [provider]  ?desvenlafaxine (PRISTIQ) 50 MG 24 hr tablet Take 50 mg by mouth daily.   Yes [provider]  ?diltiazem (TIAZAC) 360 MG 24 hr capsule Take 360 mg by mouth daily.   Yes [provider]  ?ergocalciferol (VITAMIN D2) 50000 units capsule Take 50,000 Units by mouth once a week. On the first of every month   Yes [provider]  ?famotidine (PEPCID) 40 MG tablet Take 40 mg by mouth daily. 08/01/21  Yes [provider]  ?gabapentin (NEURONTIN) 300 MG capsule Take 600 mg by mouth at bedtime. Take 1 capsule (300MG ) by mouth every morning and 2 capsules (600MG ) by mouth every evening   Yes [provider]  ?losartan (COZAAR) 100 MG tablet Take 100 mg by mouth daily.   Yes [provider]  ?Multiple Vitamin (MULTIVITAMIN) tablet Take 1 tablet by mouth daily.   Yes [provider]  ?pravastatin (PRAVACHOL) 80 MG tablet Take 80 mg by mouth daily.    Yes [provider]  ?spironolactone (ALDACTONE) 25 MG tablet Take 25 mg by  mouth daily.   Yes [provider]  ?tiZANidine (ZANAFLEX) 2 MG tablet Take ? to 1 tablet by mouth three times daily as needed   Yes [provider]  ?Scheduled Meds: ? busPIRone  15 mg Oral BID  ? carvedilol  3.125 mg Oral BID  ? Chlorhexidine Gluconate Cloth  6 each Topical Q0600  ? diltiazem  360 mg Oral Daily  ? fluconazole  100 mg Oral Daily  ? gabapentin  300 mg Oral q morning  ? gabapentin  600 mg Oral QHS  ? mouth rinse  15 mL Mouth Rinse BID  ? pantoprazole (PROTONIX) IV  40 mg Intravenous Q12H  ? venlafaxine XR  75 mg Oral Q breakfast  ? ?Continuous Infusions: ? sodium chloride    ? amiodarone 60 mg/hr (08/15/21 1345)  ? amiodarone    ? heparin 700 Units/hr (08/15/21 1223)  ? norepinephrine (LEVOPHED) Adult infusion    ? ?PRN Meds:.acetaminophen **OR** acetaminophen, ipratropium-albuterol, menthol-cetylpyridinium, metoprolol tartrate, morphine injection, morphine  injection, ondansetron **OR** ondansetron (ZOFRAN) IV, phenol, simethicone ? ? ? ? ?Assessment & Plan:  ? ?76 yo white female admitted for acute SBO with lysis of adhesions with acute b/l PE and h/o HOCM with AI(underlying struc

## 2021-08-15 NOTE — IPAL (Signed)
?  Interdisciplinary Goals of Care Family Meeting ? ? ?Date carried out: 08/15/2021 ? ?Location of the meeting: Bedside ? ?Member's involved: Physician, Nurse Practitioner, Bedside Registered Nurse, and Family Member or next of kin ? ?Code status: Full Code ? ?Disposition: Continue current acute care ? ? ? ? ? ?GOALS OF CARE DISCUSSION ? ?The Clinical status was relayed to family in detail-Daughter at bedside ? ?Updated and notified of patients medical condition- ?Patient remains unresponsive and will not open eyes to command.   ?Patient is having a weak cough and struggling to remove secretions.   ?Patient with increased WOB and using accessory muscles to breathe ?Explained to family course of therapy and the modalities  ? ?Patient with Progressive multiorgan failure with a very high probablity of a very minimal chance of meaningful recovery despite all aggressive and optimal medical therapy.  ? ? ?ACUTE CARDIAC AND RESP FAILURE WITH ACUTE ILEUS ADMITTED WITH ACUTE SBO WITH PE A ? ? ?PATIENT REMAINS FULL CODE ? ?Family understands the situation. ? ? ?Family are satisfied with Plan of action and management. All questions answered ? ?Additional CC time 30 mins ? ?PROGNOSIS IS GUARDED ? ? ? ?Lucie Leather, M.D.  ?[facility]  ?Medical Director [facility] ?Medical Director [facility] Cardio-Pulmonary Department  ? ? ?

## 2021-08-15 NOTE — Procedures (Signed)
Central Venous Catheter Insertion Procedure Note ?Rhonda Skinner ?[id number] ?[birth year] ? ?Procedure: Insertion of Central Venous Catheter ?Indications: Assessment of intravascular volume, Drug and/or fluid administration, and Frequent blood sampling ? ?Procedure Details ?Consent: Risks of procedure as well as the alternatives and risks of each were explained to the (patient/caregiver).  Consent for procedure obtained. ?Time Out: Verified patient identification, verified procedure, site/side was marked, verified correct patient position, special equipment/implants available, medications/allergies/relevent history reviewed, required imaging and test results available.  Performed ? ?Maximum sterile technique was used including antiseptics, cap, gloves, gown, hand hygiene, mask, and sheet. ?Skin prep: Chlorhexidine; local anesthetic administered ?A antimicrobial bonded/coated triple lumen catheter was placed in the right internal jugular vein using the Seldinger technique. ? ?Evaluation ?Blood flow good ?Complications: No apparent complications ?Patient did tolerate procedure well. ?Chest X-ray ordered to verify placement.  CXR: pending. ? ? ? ?Line secured at the 15 cm mark. ?BIOPATCH applied to the insertion site. ? ? ?[name], AGACNP-BC ?[facility] ?Prefer epic messenger for cross cover needs ?If after hours, please call E-link ? ? ?[name] ?08/15/2021, 4:13 PM ? ? ?

## 2021-08-16 ENCOUNTER — Inpatient Hospital Stay: Payer: Medicare PPO

## 2021-08-16 DIAGNOSIS — I4892 Unspecified atrial flutter: Secondary | ICD-10-CM

## 2021-08-16 DIAGNOSIS — R579 Shock, unspecified: Secondary | ICD-10-CM

## 2021-08-16 DIAGNOSIS — A419 Sepsis, unspecified organism: Secondary | ICD-10-CM

## 2021-08-16 DIAGNOSIS — R7401 Elevation of levels of liver transaminase levels: Secondary | ICD-10-CM

## 2021-08-16 LAB — MAGNESIUM: Magnesium: 2.7 mg/dL — ABNORMAL HIGH (ref 1.7–2.4)

## 2021-08-16 LAB — CBC
HCT: 37.1 % (ref 36.0–46.0)
Hemoglobin: 12.3 g/dL (ref 12.0–15.0)
MCH: 31.8 pg (ref 26.0–34.0)
MCHC: 33.2 g/dL (ref 30.0–36.0)
MCV: 95.9 fL (ref 80.0–100.0)
Platelets: 366 10*3/uL (ref 150–400)
RBC: 3.87 MIL/uL (ref 3.87–5.11)
RDW: 12.7 % (ref 11.5–15.5)
WBC: 16.5 10*3/uL — ABNORMAL HIGH (ref 4.0–10.5)
nRBC: 0.7 % — ABNORMAL HIGH (ref 0.0–0.2)

## 2021-08-16 LAB — COMPREHENSIVE METABOLIC PANEL WITH GFR
ALT: 1180 U/L — ABNORMAL HIGH (ref 0–44)
AST: 1934 U/L — ABNORMAL HIGH (ref 15–41)
Albumin: 2.7 g/dL — ABNORMAL LOW (ref 3.5–5.0)
Alkaline Phosphatase: 54 U/L (ref 38–126)
Anion gap: 12 (ref 5–15)
BUN: 55 mg/dL — ABNORMAL HIGH (ref 8–23)
CO2: 22 mmol/L (ref 22–32)
Calcium: 6.6 mg/dL — ABNORMAL LOW (ref 8.9–10.3)
Chloride: 97 mmol/L — ABNORMAL LOW (ref 98–111)
Creatinine, Ser: 1.56 mg/dL — ABNORMAL HIGH (ref 0.44–1.00)
GFR, Estimated: 34 mL/min — ABNORMAL LOW
Glucose, Bld: 165 mg/dL — ABNORMAL HIGH (ref 70–99)
Potassium: 4.8 mmol/L (ref 3.5–5.1)
Sodium: 131 mmol/L — ABNORMAL LOW (ref 135–145)
Total Bilirubin: 0.7 mg/dL (ref 0.3–1.2)
Total Protein: 5.9 g/dL — ABNORMAL LOW (ref 6.5–8.1)

## 2021-08-16 LAB — PHOSPHORUS: Phosphorus: 3.6 mg/dL (ref 2.5–4.6)

## 2021-08-16 LAB — HEPARIN LEVEL (UNFRACTIONATED): Heparin Unfractionated: 0.18 [IU]/mL — ABNORMAL LOW (ref 0.30–0.70)

## 2021-08-16 LAB — LACTIC ACID, PLASMA: Lactic Acid, Venous: 3.4 mmol/L (ref 0.5–1.9)

## 2021-08-16 LAB — PROCALCITONIN: Procalcitonin: 4.36 ng/mL

## 2021-08-16 MED ORDER — ASPIRIN 300 MG RE SUPP
600.0000 mg | Freq: Once | RECTAL | Status: AC
  Administered 2021-08-16: 600 mg via RECTAL
  Filled 2021-08-16: qty 2

## 2021-08-16 MED ORDER — SODIUM CHLORIDE 0.9 % IV SOLN
2.0000 g | INTRAVENOUS | Status: DC
  Filled 2021-08-16: qty 12.5

## 2021-08-16 MED ORDER — NOREPINEPHRINE 4 MG/250ML-% IV SOLN
INTRAVENOUS | Status: AC
  Filled 2021-08-16: qty 250

## 2021-08-16 MED ORDER — NOREPINEPHRINE 16 MG/250ML-% IV SOLN
0.0000 ug/min | INTRAVENOUS | Status: DC
  Filled 2021-08-16: qty 250

## 2021-08-16 MED ORDER — VANCOMYCIN HCL 1250 MG/250ML IV SOLN: 1250.0000 mg | INTRAVENOUS | Status: DC

## 2021-08-16 MED ORDER — NOREPINEPHRINE 4 MG/250ML-% IV SOLN
0.0000 ug/min | INTRAVENOUS | Status: DC
  Administered 2021-08-16: 10 ug/min via INTRAVENOUS

## 2021-08-16 MED ORDER — SODIUM CHLORIDE 0.9 % IV BOLUS
1000.0000 mL | Freq: Once | INTRAVENOUS | Status: AC
  Administered 2021-08-16: 1000 mL via INTRAVENOUS

## 2021-08-16 MED ORDER — ACETAMINOPHEN 10 MG/ML IV SOLN
1000.0000 mg | Freq: Once | INTRAVENOUS | Status: DC
  Filled 2021-08-16: qty 100

## 2021-08-16 MED ORDER — VANCOMYCIN HCL 1750 MG/350ML IV SOLN
1750.0000 mg | Freq: Once | INTRAVENOUS | Status: DC
  Filled 2021-08-16: qty 350

## 2021-08-16 MED ORDER — HEPARIN BOLUS VIA INFUSION
2000.0000 [IU] | Freq: Once | INTRAVENOUS | Status: DC
  Filled 2021-08-16: qty 2000

## 2021-08-21 LAB — CULTURE, BLOOD (ROUTINE X 2)
Culture: NO GROWTH
Culture: NO GROWTH
Special Requests: ADEQUATE
Special Requests: ADEQUATE

## 2021-09-13 NOTE — Progress Notes (Signed)
Patient belongings provided to family in ICU waiting room by this RN.  ?

## 2021-09-13 NOTE — Progress Notes (Signed)
?  Chaplain On-Call received a call from ICU Secretary [name] who reported the death of the patient, and asked the Chaplain to complete the Decedent paperwork regarding [facility] selection, etc. ? ?Chaplain stated that I have not done that before, and that the Decedent paperwork responsibility is managed by Nursing staff. ? ?Chaplain [name] ?M.Div., BCC ?

## 2021-09-13 NOTE — Progress Notes (Signed)
ANTICOAGULATION CONSULT NOTE ? ?Pharmacy Consult for heparin infusion ?Indication: pulmonary embolus ? ?Patient Measurements: ?Height: 5\' 2"  (157.5 cm) ?Weight: 74.1 kg (163 lb 5.8 oz) ?IBW/kg (Calculated) : 50.1 ?Heparin Dosing Weight: 66.1 kg ? ?Labs: ?Recent Labs  ?  08/14/21 ?0608 08/14/21 ?1526 08/15/21 ?0153 08/15/21 ?1115 Sep 14, 2021 ?0216  ?HGB 11.6*  --  11.8*  --  12.3  ?HCT 33.3*  --  34.7*  --  37.1  ?PLT 306  --  342  --  366  ?HEPARINUNFRC 1.10*   < > 0.84* 0.35 0.18*  ?CREATININE 1.21*  --  1.27*  --  1.56*  ? < > = values in this interval not displayed.  ? ? ? ?Estimated Creatinine Clearance: 28.9 mL/min (A) (by C-G formula based on SCr of 1.56 mg/dL (H)). ? ? ?Medical History: ?Past Medical History:  ?Diagnosis Date  ? Anxiety   ? Arthritis   ? CHF (congestive heart failure) (HCC)   ? Depression   ? Heart murmur   ? HOCM (hypertrophic obstructive cardiomyopathy) (HCC)   ? Hypercholesteremia   ? Hypertension   ? Osteoarthritis   ? Osteoporosis   ? Psychiatric illness   ? ? ?Medications:  ?Per chart review, no anticoagulation noted prior to admission.  ? ?Assessment: ?77yo female who is status post operation 08/12/21 for lysis of adhesions. Pharmacy consulted for heparin for pulmonary embolism.  ? ?5/1 CT chest: Two relatively small subsegmental left lower lobe and right middle lobe pulmonary emboli ? ?General surgery following. Diet is being advanced slowly, patient still with abdominal distension. Pending resolution of abdominal distension and definitive return of bowel function before further advancement. ? ?Patient is on HFNC at 8L and tachycardic today ? ?Goal of Therapy:  ?Heparin level 0.3-0.7 units/ml ?Monitor platelets by anticoagulation protocol: Yes ?  ?Plan:  ?Heparin level is subtherapeutic ?Bolus 2000 units x 1 ?Increase heparin infusion to 900 units/hr ?Recheck HL in 8 hr after rate change ?Daily CBC per protocol while on IV heparin ?Follow-up transition to oral anticoagulation when  abdominal issues resolve or as deemed appropriate by care team ? ? ?08/14/21, PharmD, MBA ?09/14/2021 ?2:58 AM ? ?

## 2021-09-13 NOTE — IPAL (Signed)
Patient continues to deteriorate, with refractory hypotension in the 30's on multiple vasopressors at maximum rates and IVF bolus.  ?While on telephone with daughter Sedalia Muta, patient lost pulse in PEA and CPR was started by nursing. ?Diane confirmed her father- the patient's spouse and her brother, Renae Fickle were in unanimous agreement to NOT perform CPR, ACLS, defibrillation. ?Patient changed to a DNR, family encouraged to come to the hospital as soon as possible. ? ? ?Cheryll Cockayne Rust-Chester, AGACNP-BC ?Acute Care Nurse Practitioner ?[facility]  ? ?[phone] / [phone] ?Please see Amion for pager details.  ? ?

## 2021-09-13 NOTE — Progress Notes (Signed)
?  Chaplain On-Call responded to a page from ICU Unit Secretary [name], who reported that the patient's condition is worsening and that family is expected to arrive soon. ? ?Chaplain met with NP [name], who provided medical update and stated that the patient's daughter and son-in-law are in the Waiting Room, waiting for other family members. ? ?Chaplain met patient's daughter [name] and her husband, who declined the Chaplain's services. ?They stated that they are [employer], and are expecting their Minister to arrive soon. ? ?Chaplain [name] ?M.Div., BCC ?

## 2021-09-13 NOTE — Progress Notes (Signed)
Pharmacy Antibiotic Note ? ?Rhonda Skinner is a 76 y.o. female admitted on 07/14/2021 with sepsis.  Pharmacy has been consulted for Cefepime and Vancomycin dosing. ? ?Plan: ?Cefepime 2 gm q24h per indication & renal fxn. ? ?Pt ordered initial dose of Vancomycin 1750 mg ?Vancomycin 1250 mg IV Q 48 hrs.  ?Goal AUC 400-550. ?Expected AUC: 477.4 ?SCr used: 1.56 ? ?Pharmacy will continue to follow and will adjust abx dosing whenever warranted. ? ?Height: 5\' 2"  (157.5 cm) ?Weight: 74.1 kg (163 lb 5.8 oz) ?IBW/kg (Calculated) : 50.1 ? ?Temp (24hrs), Avg:99.2 ?F (37.3 ?C), Min:97.3 ?F (36.3 ?C), Max:103.1 ?F (39.5 ?C) ? ?Recent Labs  ?Lab 07/23/2021 ?08/13/21 08/12/21 ?0426 08/14/21 ?10/14/21 08/15/21 ?0153 08/15/21 ?1356 08/15/21 ?1716 09-10-21 ?0216  ?WBC 4.9 6.8 10.3 13.4*  --   --  16.5*  ?CREATININE 1.03* 1.10* 1.21* 1.27*  --   --  1.56*  ?LATICACIDVEN  --   --   --   --  5.1* 2.6*  --   ?  ?Estimated Creatinine Clearance: 28.9 mL/min (A) (by C-G formula based on SCr of 1.56 mg/dL (H)).   ? ?Allergies  ?Allergen Reactions  ? Oxycodone   ? Percocet [Oxycodone-Acetaminophen]   ? Phenobarbital   ? Vasotec [Enalapril Maleate]   ? ? ?Antimicrobials this admission: ?5/04 Cefepime >> ?5/04 Vancomycin >> ?5/03 Fluconazole >> ? ?Microbiology results: ?5/04 BCx: Pending ?5/04 RespCx: Pending  ? ?Thank you for allowing pharmacy to be a part of this patient?s care. ? ?7/04, PharmD, MBA ?Sep 10, 2021 ?7:33 AM ? ? ?

## 2021-09-13 NOTE — Death Summary Note (Signed)
?DEATH SUMMARY  ? ?Patient Details  ?Name: Rhonda Skinner ?MRN: [id number] ?DOB: 28-Jan-1946 ? ?Admission/Discharge Information  ? ?Admit Date:  2021/09/01  ?Date of Death:  2021/09/11  ?Time of Death:  05:41  ?Length of Stay: 10  ?Referring Physician: Gayland Curry, MD  ? ?Reason(s) for Hospitalization  ?Small bowel obstruction s/p robotic assisted laparoscopic lysis of adhesions ? ?Diagnoses  ?Preliminary cause of death: Cardiogenic shock ?Secondary Diagnoses (including complications and co-morbidities):  ?Principal Problem: ?  Acute respiratory failure with hypoxia ([place]) ?Active Problems: ?  SBO (small bowel obstruction) ([place]) ?  Mood disorder ([place]) ?  Hypercholesteremia ?  Hypertension ?  Acute on chronic diastolic CHF (congestive heart failure) ([place]) ?  History of cardiac pacemaker ?  Type 2 diabetes mellitus with hyperlipidemia ([place]) ?  Hyponatremia ?  Weakness ?  Orthostatic hypotension ?  [place] ?  Shortness of breath ?  Acute pulmonary embolism ([place]) ?  AKI (acute kidney injury) ([place]) ?  Shock circulatory ([place]) ?  Transaminitis ?  Atrial flutter ([place]) ? ? ?Brief Hospital Course (including significant findings, care, treatment, and services provided and events leading to death)  ?Rhonda Skinner is a 76 y.o. year old female who with significant PMH of chronic diastolic CHF, hypertrophic cardiomyopathy, sick sinus syndrome status post pacemaker, HTN, HLD, T2DM, and CKD stage III, who presented to the ED with chief complaints of RLQ pain associated with nausea vomiting and p.o. intolerance. ?  ?ED Course: In the emergency department, the temperature was 36.5?C, the heart rate 72 beats/minute, the blood pressure  121/51mm Hg, the respiratory rate 16 breaths/minute, and the oxygen saturation 93% on RA. Pertinent Labs /Diagnostics Findings: Na+/ K+: 132/5.2, Glucose: 142, BUN/Cr.:  28/1.27, WBC/ TMAX: 13.5 afebrile.  CT abdomen pelvis showed dilated small bowel loops consistent with SBO.   Patient admitted to [facility] unit under hospitalist service for observation and follow-up with general surgery. ?  ?Hospital Course: Patient evaluated by general surgery who recommended conservative management as patient stable with no emergent surgical intervention.  On 4/25, NGT placed for decompression as patient with no significant improvement.  Further imaging showed persistent dilated loops of small bowel.  Patient started on clear liquids still complaining of mild abdominal bloating and pain.  4/29, patient taken to the OR for lysis of band adhesion for small bowel obstruction.  5/1, patient noted with increased shortness of breath and hypotension.  IV fluid bolus administered in place and placed back on 3.5 L of oxygen.  CT angio of the chest was obtained which showed small bilateral PE and small pleural and pericardial effusion patient transferred to stepdown unit for closer monitoring.  Due to high risk for decompensation, PCCM consulted. 5/3 patient became hypoxic with increased work of breathing and was emergently intubated on mechanical ventilatory support. Overnight patient became suddenly severely febrile with a TMAX of 105.2, refractory hypotension on 3 vasopressors at maximum rates and pupillary changes. CT head negative for acute abnormality. CXR unrevealing. Discussed clinical status changes with family and family in agreement to change CODE STATUS to DNR. Family arrived bedside and patient passed away while on mechanical ventilation and vasopressor support. ? ?Pertinent Labs and Studies  ?Significant Diagnostic Studies ?DG Abd 1 View ? ?Result Date: 08/15/2021 ?CLINICAL DATA:  OG placement EXAM: ABDOMEN - 1 VIEW COMPARISON:  08/06/2021 FINDINGS: NG tube in the body of the stomach with the side hole near the fundus. Diffuse small bowel dilatation with mild improvement. Colon decompressed on recent CT.  Findings compatible with small-bowel obstruction. IMPRESSION: NG tube in the body the stomach.  Small-bowel obstruction pattern with mild improvement since 08/05/2021. Electronically Signed   By: Franchot Gallo M.D.   On: 08/15/2021 14:54  ? ?DG Abd 1 View ? ?Result Date: 08/07/2021 ?CLINICAL DATA:  Nasogastric tube placement. EXAM: ABDOMEN - 1 VIEW COMPARISON:  Same day. FINDINGS: Distal tip of nasogastric tube is seen in proximal stomach. Stable small bowel dilatation is noted concerning for distal small bowel obstruction. IMPRESSION: Distal tip of nasogastric tube seen in proximal stomach. Electronically Signed   By: Marijo Conception M.D.   On: 08/07/2021 12:45  ? ?CT HEAD WO CONTRAST (5MM) ? ?Result Date: 09/05/2021 ?CLINICAL DATA:  Altered mental status EXAM: CT HEAD WITHOUT CONTRAST TECHNIQUE: Contiguous axial images were obtained from the base of the skull through the vertex without intravenous contrast. RADIATION DOSE REDUCTION: This exam was performed according to the departmental dose-optimization program which includes automated exposure control, adjustment of the mA and/or kV according to patient size and/or use of iterative reconstruction technique. COMPARISON:  None Available. FINDINGS: Brain: There is no mass, hemorrhage or extra-axial collection. There is generalized atrophy without lobar predilection. Hypodensity of the white matter is most commonly associated with chronic microvascular disease. Vascular: No abnormal hyperdensity of the major intracranial arteries or dural venous sinuses. No intracranial atherosclerosis. Skull: The visualized skull base, calvarium and extracranial soft tissues are normal. Sinuses/Orbits: No fluid levels or advanced mucosal thickening of the visualized paranasal sinuses. No mastoid or middle ear effusion. The orbits are normal. IMPRESSION: 1. No acute intracranial abnormality. 2. Chronic microvascular ischemia and generalized atrophy. Electronically Signed   By: Ulyses Jarred M.D.   On: 09-05-21 04:02  ? ?CT Angio Chest Pulmonary Embolism (PE) W or WO  Contrast ? ?Result Date: 08/13/2021 ?CLINICAL DATA:  Chest pain or SOB, pleurisy or effusion suspected; Abdominal pain, acute, nonlocalized EXAM: CT ANGIOGRAPHY CHEST CT ABDOMEN AND PELVIS WITH CONTRAST TECHNIQUE: Multidetector CT imaging of the chest was performed using the standard protocol during bolus administration of intravenous contrast. Multiplanar CT image reconstructions and MIPs were obtained to evaluate the vascular anatomy. Multidetector CT imaging of the abdomen and pelvis was performed using the standard protocol during bolus administration of intravenous contrast. RADIATION DOSE REDUCTION: This exam was performed according to the departmental dose-optimization program which includes automated exposure control, adjustment of the mA and/or kV according to patient size and/or use of iterative reconstruction technique. CONTRAST:  190mL OMNIPAQUE IOHEXOL 350 MG/ML SOLN COMPARISON:  08/03/2021 FINDINGS: CTA CHEST FINDINGS Cardiovascular: Right subclavian transvenous leads extend to the right atrium and towards the RV apex. Four-chamber cardiac enlargement. Small pericardial effusion as before. The RV is nondilated. Good contrast opacification of pulmonary arterial tree. Subsegmental partially occlusive pulmonary emboli are noted in a posterior basal segment left lower lobe PA branch (Im177,Se5) and right middle lobe PA branch (Im141,Se5) . mitral annulus calcifications. Coronary calcifications. Incomplete opacification of the thoracic aorta, with scattered atheromatous plaque in the arch and descending segment. Mediastinum/Nodes: No mass or adenopathy. Lungs/Pleura: Small bilateral pleural effusions. Dependent atelectasis posteriorly in both lower lobes. Calcified granuloma in the superior segment right lower lobe, and lateral basal segment left lower lobe. Musculoskeletal: Spondylitic changes in the lower cervical, mid and lower thoracic spine. Review of the MIP images confirms the above findings. CT  ABDOMEN and PELVIS FINDINGS Hepatobiliary: No focal liver abnormality is seen. No gallstones, gallbladder wall thickening, or biliary dilatation. Pancreas: Unremarkable. No pancreatic ductal dilatation  or surrounding inflammator

## 2021-09-13 NOTE — Progress Notes (Addendum)
Alerted by nursing of patient's suddenly climbing temperature to > 104 on esophageal probe, confirmed by rectal temp at 105.2 post tylenol 650 mg administration.  ?Pupils unequal: L #3 and sluggish, R #6 and non reactive. Per nursing this is a new development ? ?Discussed with Dr. Burnard Leigh who agreed with plan of care below ?Suspected Sepsis  ?- 600 mg aspirin rectal ordered since no PO access, f/u one time IV tylenol at 6 am ?- cooling blanket ordered, ice packs placed, continuous core temperature monitoring ?- Supplemental oxygen as needed, to maintain SpO2 > 90% ?- f/u cultures: UA, tracheal aspirate, blood cultures ?- STAT lactic/ PCT, then trend ?- Daily CBC, monitor WBC/ fever curve ?- IV antibiotics: cefepime & vancomycin added to Diflucan daily ?- IVF hydration as needed: consider LR bolus if lactic is trending up ?- Continue vasopressors to maintain MAP< 65, phenylephrine & vasopressin ?- Strict I/O's: alert provider if UOP < 0.5 mL/kg/hr ? ?Transaminitis  ?- Trend hepatic function ?- STAT RUQ [place] ?- avoid hepatotoxic agents ?- general surgery following, appreciate input ? ?Pupillary changes  ?Patient receiving Heparin infusion for bilateral PE's, concern for intracranial bleeding ?- heparin paused ?- STAT CTH ? ?Addendum: CTH negative for acute abnormalities, pupillary changes persist & patient more hypotensive maxed on neo-synephrine & vasopressin drips.  ?- 1 L NS bolus ?- levophed drip ordered ?- STAT tele-neuro consult ordered ?- called and left a voicemail for the patient's son to update him on the patient's status & deterioration ? ?30 minutes additional time critical care time ? ?[name], AGACNP-BC ?Acute Care Nurse Practitioner ?[facility]  ? ?[phone] / [phone] ?Please see Amion for pager details.  ? ?

## 2021-09-13 NOTE — Significant Event (Signed)
Pt went PEA with a BP in the 30s and coded around 0430. One round of Epi and one round of CPR was done. NP Cheryll Cockayne called son and daughter and left a voicemail, she was able to get a hold of daughter to confirm they didn't want any CPR preformed. Family was notified to come at bedside and other interventions were continued. Pt passed away at 0541 called by NP, died on the vent and with all pressors maxed out. She had NEO, vaso and levophed running. Pt was extubated at 0555, and family is at bed side, family arrived before pt passed away. [place] was notified by Elink. ? ? ?She was at 100% FiO2 on the vent with HR in the 60s Ventricular paced, and BP map in the 20s and 30s. Pt had a stat CT of the Head done around 0400 due to change in BP, pupils unequal (right 6, left-3)and very sluggish, temp had increased from 99.3 to 104.7 over 5 hours, ice packs were placed, pt was bathed in cold water and tylenol was given and then cooling blanket was placed, rectal temp was checked to confirm temp and it was 105.2 after all interventions. Family confirmed with NP for pt to become DNR after NP spoke to them over the phone on pt's deteriorating status. ?

## 2021-09-13 DEATH — deceased
